# Patient Record
Sex: Male | Born: 1965 | Race: White | Hispanic: No | Marital: Married | Smoking: Never smoker
Health system: Southern US, Community
[De-identification: ages and names within clinical notes are randomized; demographics above are authoritative.]

## PROBLEM LIST (undated history)

## (undated) ENCOUNTER — Ambulatory Visit (HOSPITAL_COMMUNITY): Disposition: A | Discharge status: Home / Self Care | Payer: BC Managed Care – PPO

## (undated) DIAGNOSIS — I10 Essential (primary) hypertension: Secondary | ICD-10-CM

## (undated) DIAGNOSIS — E7201 Cystinuria: Secondary | ICD-10-CM

## (undated) DIAGNOSIS — IMO0002 Reserved for concepts with insufficient information to code with codable children: Secondary | ICD-10-CM

## (undated) DIAGNOSIS — Z87442 Personal history of urinary calculi: Secondary | ICD-10-CM

## (undated) DIAGNOSIS — M1990 Unspecified osteoarthritis, unspecified site: Secondary | ICD-10-CM

## (undated) DIAGNOSIS — N189 Chronic kidney disease, unspecified: Secondary | ICD-10-CM

## (undated) DIAGNOSIS — E785 Hyperlipidemia, unspecified: Secondary | ICD-10-CM

## (undated) DIAGNOSIS — M109 Gout, unspecified: Secondary | ICD-10-CM

## (undated) DIAGNOSIS — E039 Hypothyroidism, unspecified: Secondary | ICD-10-CM

## (undated) HISTORY — DX: Hyperlipidemia, unspecified: E78.5

## (undated) HISTORY — DX: Gout, unspecified: M10.9

## (undated) HISTORY — DX: Chronic kidney disease, unspecified: N18.9

## (undated) HISTORY — DX: Cystinuria: E72.01

## (undated) HISTORY — PX: COLONOSCOPY: SHX174

## (undated) HISTORY — PX: POLYPECTOMY: SHX149

## (undated) HISTORY — PX: KIDNEY STONE SURGERY: SHX686

---

## 1991-08-15 HISTORY — PX: APPENDECTOMY: SHX54

## 1999-03-27 ENCOUNTER — Emergency Department (HOSPITAL_COMMUNITY): Admission: EM | Admit: 1999-03-27 | Discharge: 1999-03-28 | Payer: Self-pay | Admitting: Internal Medicine

## 2005-03-10 ENCOUNTER — Ambulatory Visit (HOSPITAL_BASED_OUTPATIENT_CLINIC_OR_DEPARTMENT_OTHER): Admission: RE | Admit: 2005-03-10 | Discharge: 2005-03-10 | Payer: Self-pay | Admitting: Urology

## 2005-03-10 ENCOUNTER — Ambulatory Visit (HOSPITAL_COMMUNITY): Admission: RE | Admit: 2005-03-10 | Discharge: 2005-03-10 | Payer: Self-pay | Admitting: Urology

## 2007-01-09 ENCOUNTER — Encounter: Admission: RE | Admit: 2007-01-09 | Discharge: 2007-01-09 | Payer: Self-pay | Admitting: Gastroenterology

## 2007-09-17 ENCOUNTER — Ambulatory Visit (HOSPITAL_COMMUNITY): Admission: RE | Admit: 2007-09-17 | Discharge: 2007-09-18 | Payer: Self-pay | Admitting: Urology

## 2008-08-14 HISTORY — PX: BACK SURGERY: SHX140

## 2009-05-07 ENCOUNTER — Encounter: Admission: RE | Admit: 2009-05-07 | Discharge: 2009-05-07 | Payer: Self-pay | Admitting: Family Medicine

## 2009-06-02 ENCOUNTER — Encounter: Admission: RE | Admit: 2009-06-02 | Discharge: 2009-06-02 | Payer: Self-pay | Admitting: Specialist

## 2009-06-16 ENCOUNTER — Observation Stay (HOSPITAL_COMMUNITY): Admission: RE | Admit: 2009-06-16 | Discharge: 2009-06-18 | Payer: Self-pay | Admitting: Specialist

## 2010-09-04 ENCOUNTER — Encounter: Payer: Self-pay | Admitting: Urology

## 2010-11-16 LAB — CBC
HCT: 29.7 % — ABNORMAL LOW (ref 39.0–52.0)
HCT: 30.3 % — ABNORMAL LOW (ref 39.0–52.0)
HCT: 30.6 % — ABNORMAL LOW (ref 39.0–52.0)
HCT: 37.6 % — ABNORMAL LOW (ref 39.0–52.0)
Hemoglobin: 10.4 g/dL — ABNORMAL LOW (ref 13.0–17.0)
Hemoglobin: 10.6 g/dL — ABNORMAL LOW (ref 13.0–17.0)
Hemoglobin: 10.7 g/dL — ABNORMAL LOW (ref 13.0–17.0)
Hemoglobin: 13 g/dL (ref 13.0–17.0)
MCHC: 34.6 g/dL (ref 30.0–36.0)
MCHC: 34.8 g/dL (ref 30.0–36.0)
MCHC: 34.9 g/dL (ref 30.0–36.0)
MCHC: 35 g/dL (ref 30.0–36.0)
MCV: 92 fL (ref 78.0–100.0)
MCV: 92.2 fL (ref 78.0–100.0)
MCV: 92.7 fL (ref 78.0–100.0)
MCV: 93.5 fL (ref 78.0–100.0)
Platelets: 160 10*3/uL (ref 150–400)
Platelets: 189 10*3/uL (ref 150–400)
Platelets: 194 10*3/uL (ref 150–400)
Platelets: 276 10*3/uL (ref 150–400)
RBC: 3.23 MIL/uL — ABNORMAL LOW (ref 4.22–5.81)
RBC: 3.24 MIL/uL — ABNORMAL LOW (ref 4.22–5.81)
RBC: 3.32 MIL/uL — ABNORMAL LOW (ref 4.22–5.81)
RBC: 4.05 MIL/uL — ABNORMAL LOW (ref 4.22–5.81)
RDW: 13.5 % (ref 11.5–15.5)
RDW: 13.9 % (ref 11.5–15.5)
RDW: 14 % (ref 11.5–15.5)
RDW: 14.2 % (ref 11.5–15.5)
WBC: 13.6 10*3/uL — ABNORMAL HIGH (ref 4.0–10.5)
WBC: 7.1 10*3/uL (ref 4.0–10.5)
WBC: 8.4 10*3/uL (ref 4.0–10.5)
WBC: 9.7 10*3/uL (ref 4.0–10.5)

## 2010-11-16 LAB — PROTIME-INR
INR: 1.04 (ref 0.00–1.49)
Prothrombin Time: 13.5 seconds (ref 11.6–15.2)

## 2010-11-16 LAB — GLUCOSE, CAPILLARY
Glucose-Capillary: 121 mg/dL — ABNORMAL HIGH (ref 70–99)
Glucose-Capillary: 126 mg/dL — ABNORMAL HIGH (ref 70–99)
Glucose-Capillary: 126 mg/dL — ABNORMAL HIGH (ref 70–99)
Glucose-Capillary: 128 mg/dL — ABNORMAL HIGH (ref 70–99)
Glucose-Capillary: 143 mg/dL — ABNORMAL HIGH (ref 70–99)
Glucose-Capillary: 144 mg/dL — ABNORMAL HIGH (ref 70–99)
Glucose-Capillary: 156 mg/dL — ABNORMAL HIGH (ref 70–99)
Glucose-Capillary: 156 mg/dL — ABNORMAL HIGH (ref 70–99)
Glucose-Capillary: 164 mg/dL — ABNORMAL HIGH (ref 70–99)
Glucose-Capillary: 205 mg/dL — ABNORMAL HIGH (ref 70–99)

## 2010-11-16 LAB — URINALYSIS, ROUTINE W REFLEX MICROSCOPIC
Bilirubin Urine: NEGATIVE
Glucose, UA: NEGATIVE mg/dL
Hgb urine dipstick: NEGATIVE
Ketones, ur: NEGATIVE mg/dL
Nitrite: NEGATIVE
Protein, ur: 30 mg/dL — AB
Specific Gravity, Urine: 1.026 (ref 1.005–1.030)
Urobilinogen, UA: 0.2 mg/dL (ref 0.0–1.0)
pH: 5.5 (ref 5.0–8.0)

## 2010-11-16 LAB — COMPREHENSIVE METABOLIC PANEL
ALT: 32 U/L (ref 0–53)
AST: 33 U/L (ref 0–37)
Albumin: 4.1 g/dL (ref 3.5–5.2)
Alkaline Phosphatase: 56 U/L (ref 39–117)
BUN: 22 mg/dL (ref 6–23)
CO2: 31 mEq/L (ref 19–32)
Calcium: 9.8 mg/dL (ref 8.4–10.5)
Chloride: 100 mEq/L (ref 96–112)
Creatinine, Ser: 1.72 mg/dL — ABNORMAL HIGH (ref 0.4–1.5)
GFR calc Af Amer: 53 mL/min — ABNORMAL LOW (ref >60)
GFR calc non Af Amer: 43 mL/min — ABNORMAL LOW (ref >60)
Glucose, Bld: 134 mg/dL — ABNORMAL HIGH (ref 70–99)
Potassium: 3.8 mEq/L (ref 3.5–5.1)
Sodium: 140 mEq/L (ref 135–145)
Total Bilirubin: 0.8 mg/dL (ref 0.3–1.2)
Total Protein: 7.5 g/dL (ref 6.0–8.3)

## 2010-11-16 LAB — PLATELET FUNCTION ASSAY
Collagen / ADP: 114 seconds (ref 0–114)
Collagen / Epinephrine: 123 seconds (ref 0–184)
Collagen / Epinephrine: 186 seconds (ref 0–184)

## 2010-11-16 LAB — BASIC METABOLIC PANEL
BUN: 17 mg/dL (ref 6–23)
CO2: 29 mEq/L (ref 19–32)
Calcium: 9 mg/dL (ref 8.4–10.5)
Chloride: 104 mEq/L (ref 96–112)
Creatinine, Ser: 1.55 mg/dL — ABNORMAL HIGH (ref 0.4–1.5)
GFR calc Af Amer: 59 mL/min — ABNORMAL LOW (ref >60)
GFR calc non Af Amer: 49 mL/min — ABNORMAL LOW (ref >60)
Glucose, Bld: 142 mg/dL — ABNORMAL HIGH (ref 70–99)
Potassium: 4.8 mEq/L (ref 3.5–5.1)
Sodium: 138 mEq/L (ref 135–145)

## 2010-11-16 LAB — URINE MICROSCOPIC-ADD ON

## 2010-11-16 LAB — DIFFERENTIAL
Basophils Absolute: 0 10*3/uL (ref 0.0–0.1)
Basophils Relative: 0 % (ref 0–1)
Eosinophils Absolute: 0.1 10*3/uL (ref 0.0–0.7)
Eosinophils Relative: 1 % (ref 0–5)
Lymphocytes Relative: 12 % (ref 12–46)
Lymphs Abs: 0.8 10*3/uL (ref 0.7–4.0)
Monocytes Absolute: 0.2 10*3/uL (ref 0.1–1.0)
Monocytes Relative: 3 % (ref 3–12)
Neutro Abs: 6 10*3/uL (ref 1.7–7.7)
Neutrophils Relative %: 84 % — ABNORMAL HIGH (ref 43–77)

## 2010-11-16 LAB — POCT I-STAT 4, (NA,K, GLUC, HGB,HCT)
Glucose, Bld: 158 mg/dL — ABNORMAL HIGH (ref 70–99)
HCT: 32 % — ABNORMAL LOW (ref 39.0–52.0)
Hemoglobin: 10.9 g/dL — ABNORMAL LOW (ref 13.0–17.0)
Potassium: 4.7 mEq/L (ref 3.5–5.1)
Sodium: 136 mEq/L (ref 135–145)

## 2010-11-16 LAB — APTT: aPTT: 30 seconds (ref 24–37)

## 2010-12-27 NOTE — Op Note (Signed)
NAME:  Jorge Dyer, Jorge Dyer                 ACCOUNT NO.:  [id number]   MEDICAL RECORD NO.:  [id number]          PATIENT TYPE:  AMB   LOCATION:  DAY                          FACILITY:  [facility]   PHYSICIAN:  Mark C. Vernie Ammons, M.D.  DATE OF BIRTH:  December 01, 1965   DATE OF PROCEDURE:  09/17/2007  DATE OF DISCHARGE:                               OPERATIVE REPORT   PREOPERATIVE DIAGNOSIS:  Left renal calculus.   POSTOPERATIVE DIAGNOSIS:  Left renal calculus.   PROCEDURE:  Left percutaneous nephrostolithotomy (aggregate size 2.1cm)  with antegrade nephrostogram and double-J stent placement.   SURGEON:  Mark C. Vernie Ammons, M.D.   RADIOLOGIST:  Arn Medal, M.D.   ANESTHESIA:  General.   BLOOD LOSS:  Approximately 100 mL.   DRAINS:  6 French 24 cm double-J stent in the left ureter.   COMPLICATIONS:  None seen.   SPECIMENS:  Stone given to the patient.   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  He had a large cystine stone located at the ureteropelvic  junction and since it was too large for ureteroscopic treatment in an  antegrade fashion and its composition made it not suitable for  lithotripsy, I discussed with the patient percutaneous  nephrostolithotomy.  We went over the procedure, risks, complications,  alternatives, and limitations.  He understands and elected to proceed.   DESCRIPTION OF OPERATION:  After informed consent, the patient was taken  to the major OR, placed on the operating table, and administered general  anesthesia in the supine position.  He was then moved to the prone  position with all pressure points being padded.  The previously placed  nephrostomy tube exiting the left flank was identified and the  nephrostomy tube as well as the left flank was sterilely prepped and  draped.  An official time out was then performed.   A 0.03 inch floppy tip guidewire was then passed down through the  nephrostomy catheter by Dr. Lowella Dandy under direct  fluoroscopy.  A coaxial  introducer was then placed and the skin was incised to accommodate the  30-French nephrostomy sheath.  A second guidewire was then passed under  direct fluoroscopy and then the nephrostomy dilating balloon was then  passed over the working guidewire and a second guidewire was maintained  as a safety guidewire.  The balloon was then inflated and the  nephrostomy access sheath was then passed over the inflated nephrostomy  into the area of the renal pelvis.   The 72 French rigid nephroscope was then passed over the working  guidewire, through the nephrostomy sheath, and into the kidney where the  stone was visualized.  I then used the Swiss lithoclast to fragment the  stone using primarily ultrasound in order to fragment and aspirate the  stone simultaneously with intermittent use of the pneumatic portion to  speed fragmentation of the stone.  I was able to fully fragment the  stone and completely removed all the fragments.  I was able to then  visualize down the ureter and see no further stone fragments present  down the ureter nor in the area of the UPJ or renal pelvis.  I then  directed the scope toward the middle pole where I found a single stone  and was able to grasp it with the two pronged grasping tool and actually  crush the stone into less than 7 mm fragments.   The renal pelvis was then visually inspected and noted to appear intact.  I, therefore, passed a second guidewire back down the ureter under  fluoroscopy and passed the 6 French double-J stent over the guidewire.  As I removed the guidewire, a good curl was noted in the bladder.  I  then completely removed the guidewire and passed the nephroscope over  the safety guidewire and then visualized the stent.  It was located at  the UPJ so I grasped it with the two pronged grasper and pulled it into  the renal pelvis where a good curl was noted in the area of the renal  pelvis.  There was no active  bleeding occurring so I removed the  nephroscope and then elected to perform this as a percutaneous  nephrostolithotomy.  The Tisseel solutions were connected to the  laparoscopic injector and this was passed into the wound that was  previously measured to the depth of the kidney and I injected the  Tisseel as I removed the laparoscopic injector.  No bleeding was noted  and I, therefore, closed the skin with a figure-of-eight 2-0 nylon  suture.  A sterile occlusive dressing was applied and the patient was  awakened and taken to recovery room in stable satisfactory condition.  A  Foley catheter was placed at the beginning of the procedure and that  will remain overnight and he will be observed overnight with plans to  discharge home in the morning.      Mark C. Vernie Ammons, M.D.  Electronically Signed     MCO/MEDQ  D:  09/17/2007  T:  09/17/2007  Job:  [id number]

## 2010-12-30 NOTE — Op Note (Signed)
NAME:  Jorge Dyer, Jorge Dyer NO.:  [id number]   MEDICAL RECORD NO.:  [id number]          PATIENT TYPE:  AMB   LOCATION:  [facility]                         FACILITY:  [facility]   PHYSICIAN:  Courtney Paris, M.D.DATE OF BIRTH:  1966/02/13   DATE OF PROCEDURE:  03/10/2005  DATE OF DISCHARGE:                                 OPERATIVE REPORT   PREOPERATIVE DIAGNOSIS:  Desires sterility.   POSTOPERATIVE DIAGNOSIS:  Desires sterility.   PROCEDURE:  Bilateral vasectomy.   DESCRIPTION OF PROCEDURE:  The patient was identified by his surgical  bracelet and brought to room 1 where he received preoperative antibiotics  and prepped and draped in the usual sterile fashion.  We palpated his left  cord and isolated the vas deferens and brought it to the skin anteriorly.  A  small skin nick was made over the vas deferens which was held between first  and fifth finger.  We dissected down through the subcutaneous tissue with  microdissector.  Next, the vas deferens was isolated with the ring pick-up  and brought through the incision.  Using sharp dissection, we cleared off  the adventitia and isolated the vas deferens only.  It was ligated with 3-0  chromic suture and divided with a gap of approximately 0.5 cm.  We then  inspected the operative field for hemostasis, found to be hemostatic, and we  dropped the divided ends of the vas through the incision which was closed  with a single subcuticular 4-0 chromic suture.  We next repeated the process  on the right, isolating the vas deferens between the surgeon's first and  fifth finger, bringing it to the level of the skin, making a small skin nick  transversely, then dissecting through the subcutaneous tissue with sharp  microdissectors.  We then brought the cord, which was palpated on the  surgeon's fifth finger, through the incision on the ring pick-up forceps.  We next incised the adventitia sharply, isolating only the vas deferens.  We  ligated proximal and distal and divided 2.5 cm in length and ligated the  proximal and distal end with 3-0 chromic ties.  Next, the wound was found to  be hemostatic.  The divided vas deferens was returned into the field, and  the wound was closed with a single subcuticular 0 chromic stitch.  The  patient was reversed, and the anesthesia was tolerated well without  complications.  Please note Dr. Aldean Ast was present and participated in  all aspects of this case.     ______________________________  Glade Nurse, MD      Courtney Paris, M.D.  Electronically Signed    MT/MEDQ  D:  03/10/2005  T:  03/10/2005  Job:  [id number]

## 2010-12-30 NOTE — Op Note (Signed)
NAME:  Jorge Dyer, Jorge Dyer                 ACCOUNT NO.:  [id number]   MEDICAL RECORD NO.:  [id number]          PATIENT TYPE:  AMB   LOCATION:  [facility]                         FACILITY:  [facility]   PHYSICIAN:  Courtney Paris, M.D.DATE OF BIRTH:  Apr 17, 1966   DATE OF PROCEDURE:  03/10/2005  DATE OF DISCHARGE:  03/10/2005                                 OPERATIVE REPORT   PREOPERATIVE DIAGNOSIS:  Elective sterilization.   POSTOPERATIVE DIAGNOSIS:  Elective sterilization.   OPERATION:  Bilateral vasectomy.   ANESTHESIA:  General.   SURGEON:  Courtney Paris, M.D.   ASSISTANT:  Glade Nurse, M.D.   BRIEF HISTORY:  This 45 year old patient was admitted for elective  sterilization.  He had a very small scrotum and retractile testes in the  office and I felt he would not be a good candidate to do this under local  anesthesia in the office.  Therefore, anesthesia was done.   DESCRIPTION OF PROCEDURE:  He was prepped and draped in the usual sterile  fashion and then the vas was picked up in the left upper scrotum and a small  transverse incision made over this.  Using a vas hook, this was pulled out  and the vas was dissected free from the cord.  A small section was then cut  and the ends were then tied with 3-0 chromic catgut suture and the vas  dropped back into the left hemiscrotal compartment.  On the patient's right  side, a similar procedure was done.  The vas was picked up and a small  transverse incision made over the vas in the right upper scrotum and the vas  was delivered in a similar fashion.  A small segment was again dissected  free and the ends were then tied with 3-0 chromic catgut suture.  Hemostasis  was quite good.  The vas was then dropped back in the right hemiscrotum and  the skin incisions were then closed with interrupted 4-0 chromic suture and  a little bit of local was used over each incision for postoperative pain  relief.  The patient was then taken to the  recovery room in good condition  and will be later discharged as an outpatient.      Courtney Paris, M.D.  Electronically Signed     HMK/MEDQ  D:  03/30/2005  T:  03/30/2005  Job:  [id number]

## 2011-05-04 LAB — BASIC METABOLIC PANEL
BUN: 15
CO2: 31
Calcium: 9.5
Chloride: 98
Creatinine, Ser: 1.21
GFR calc Af Amer: >60
GFR calc non Af Amer: >60
Glucose, Bld: 208 — ABNORMAL HIGH
Potassium: 3.6
Sodium: 137

## 2011-05-04 LAB — URINALYSIS, ROUTINE W REFLEX MICROSCOPIC
Bilirubin Urine: NEGATIVE
Glucose, UA: NEGATIVE
Hgb urine dipstick: NEGATIVE
Ketones, ur: NEGATIVE
Nitrite: NEGATIVE
Protein, ur: NEGATIVE
Specific Gravity, Urine: 1.016
Urobilinogen, UA: 0.2
pH: 7

## 2011-05-04 LAB — CBC
HCT: 39.4
Hemoglobin: 13.7
MCHC: 34.8
MCV: 92
Platelets: 224
RBC: 4.29
RDW: 13.7
WBC: 7.5

## 2011-05-05 LAB — HEMOGLOBIN AND HEMATOCRIT, BLOOD
HCT: 34.9 — ABNORMAL LOW
Hemoglobin: 12.2 — ABNORMAL LOW

## 2011-10-30 ENCOUNTER — Ambulatory Visit (INDEPENDENT_AMBULATORY_CARE_PROVIDER_SITE_OTHER): Payer: BC Managed Care – PPO | Admitting: Family Medicine

## 2011-10-30 VITALS — BP 127/76 | HR 71 | Temp 98.0°F | Resp 16 | Ht 71.5 in | Wt 285.2 lb

## 2011-10-30 DIAGNOSIS — H6690 Otitis media, unspecified, unspecified ear: Secondary | ICD-10-CM

## 2011-10-30 DIAGNOSIS — E119 Type 2 diabetes mellitus without complications: Secondary | ICD-10-CM | POA: Insufficient documentation

## 2011-10-30 DIAGNOSIS — J019 Acute sinusitis, unspecified: Secondary | ICD-10-CM

## 2011-10-30 DIAGNOSIS — J329 Chronic sinusitis, unspecified: Secondary | ICD-10-CM

## 2011-10-30 DIAGNOSIS — H66009 Acute suppurative otitis media without spontaneous rupture of ear drum, unspecified ear: Secondary | ICD-10-CM

## 2011-10-30 MED ORDER — CEFDINIR 300 MG PO CAPS: 300.0000 mg | ORAL_CAPSULE | Freq: Two times a day (BID) | ORAL | Status: AC

## 2011-10-30 NOTE — Progress Notes (Signed)
  Patient Name: Jorge Dyer Date of Birth: 01-16-1966 Medical Record Number: [id number] Gender: male Date of Encounter: 10/30/2011  History of Present Illness:  Jorge Dyer is a 46 y.o. very pleasant male patient who presents with the following:  Here with sinus symptoms for about one week- has sinus pressure and pain, mild right earache as well, mild cough started the last couple of days as well.  He has not had a fever- he has checked.  No chills, no aches.  He does have a mild ST.  No GI symptoms.   His PCP is Dr. Clelia Croft- he follows his DM for him.  His glucose usually run 180 or so - this has not changed  Patient Active Problem List  Diagnoses  . Diabetes mellitus   No past medical history on file. No past surgical history on file. History  Substance Use Topics  . Smoking status: Never Smoker   . Smokeless tobacco: Not on file  . Alcohol Use: Yes     ocassionally   No family history on file. No Known Allergies  Medication list has been reviewed and updated.  Review of Systems: As per HPI- otherwise negative.  Physical Examination: Filed Vitals:   10/30/11 1506  BP: 127/76  Pulse: 71  Temp: 98 F (36.7 C)  TempSrc: Oral  Resp: 16  Height: 5' 11.5" (1.816 m)  Weight: 285 lb 3.2 oz (129.366 kg)    Body mass index is 39.22 kg/(m^2).  GEN: WDWN, NAD, Non-toxic, A & O x 3, obese HEENT: Atraumatic, Normocephalic. Neck supple. No masses, No LAD.  Right TM wnl, left TM is red and dull, oropharynx wnl.  Frontal sinuses congested and full Ears and Nose: No external deformity. CV: RRR, No M/G/R. No JVD. No thrill. No extra heart sounds. PULM: CTA B, no wheezes, crackles, rhonchi. No retractions. No resp. distress. No accessory muscle use. Extr: no C/C/E NEURO Normal gait.  PSYCH: Normally interactive. Conversant. Not depressed or anxious appearing.  Calm demeanor.    Assessment and Plan: 1. Sinusitis  cefdinir (OMNICEF) 300 MG capsule  2. Diabetes mellitus    3.  Otitis media  cefdinir (OMNICEF) 300 MG capsule   Cover with omnicef as above- Patient (or parent if minor) instructed to return to clinic or call if not better in >25 minutes spent in face to face time with patient, >50% spent in counselling or coordination of care-3 day(s). Sooner if worse.

## 2012-01-29 ENCOUNTER — Ambulatory Visit (INDEPENDENT_AMBULATORY_CARE_PROVIDER_SITE_OTHER): Payer: BC Managed Care – PPO | Admitting: Family Medicine

## 2012-01-29 VITALS — BP 119/76 | HR 68 | Temp 97.9°F | Resp 18 | Ht 72.0 in | Wt 280.0 lb

## 2012-01-29 DIAGNOSIS — R1011 Right upper quadrant pain: Secondary | ICD-10-CM

## 2012-01-29 NOTE — Progress Notes (Signed)
Patient ID: Jorge Dyer, male   DOB: 1965/09/03, 46 y.o.   MRN: [id number] Jorge Dyer is a 46 y.o. male who presents to Urgent Care today for RUQ pain of past 4-5 day's duration.    1. RUQ pain: Has never had pain like this previously.  Describes as dull aching pain. No history of gallbladder issues in past.  Describes as 2-3/10 in nature, worsens to 4/10 after meals.  Has been cutting back on fatty/greasy foods.  Wife had issues with gallstones and had cholecystectomy, thus he knew to cut back on greasy foods.  No nausea, vomiting, fevers, chills.  Sleeping well without pain at night.    Bowels normal, daily bowel movement and passing flatus.    PMH reviewed.  Previous appendectomy as only surgery, about 20 years ago.  No other surgeries. ROS as above otherwise neg Medications reviewed. Current Outpatient Prescriptions  Medication Sig Dispense Refill  . levothyroxine (SYNTHROID, LEVOTHROID) 25 MCG tablet Take 25 mcg by mouth daily.      . Liraglutide (VICTOZA) 18 MG/3ML SOLN Inject into the skin.      Marland Kitchen losartan (COZAAR) 25 MG tablet Take 25 mg by mouth daily.      . simvastatin (ZOCOR) 10 MG tablet Take 10 mg by mouth at bedtime.      Marland Kitchen tiopronin (THIOLA) 100 MG tablet Take 600 mg by mouth daily. Pt states he takes six 100 mg daily        Exam:  BP 119/76  Pulse 68  Temp 97.9 F (36.6 C) (Oral)  Resp 18  Ht 6' (1.829 m)  Wt 280 lb (127.007 kg)  BMI 37.97 kg/m2 Gen: Well NAD HEENT: EOMI,  MMM Lungs: CTABL Nl WOB Heart: RRR no MRG Abd:  Minimal Right upper quadrant pain.  Murphy's negative.  Otherwise abdomen benign, no guarding or rebound, BS good throughout.  Surgical scar noted. Exts: Non edematous BL  LE, warm and well perfused.   Assessment and Plan:  1. RUQ pain:  Likely cholecystitis.  Plan for urgent ultrasound and referral to surgery, especially as patient desires referral.  FU with [place] and surgery in next 1-2 days.  Provided patient red flags that would prompt return  to ED or here.  No labs tonight due to mild abdominal exam.

## 2012-01-29 NOTE — Patient Instructions (Addendum)
I have put in an order for an abdominal ultrasound.  I have also put in a referral to general surgery.   If you haven't heard anything by the end of the week please call and check with [place].   If you have fever, chills, nausea and vomiting, or worsening of pain come back and see [place] or go to the ED if it is after hours.

## 2012-02-05 ENCOUNTER — Ambulatory Visit
Admission: RE | Admit: 2012-02-05 | Discharge: 2012-02-05 | Disposition: A | Discharge status: Home / Self Care | Payer: BC Managed Care – PPO | Source: Ambulatory Visit | Attending: Internal Medicine | Admitting: Internal Medicine

## 2012-02-05 DIAGNOSIS — R1011 Right upper quadrant pain: Secondary | ICD-10-CM

## 2012-02-07 ENCOUNTER — Telehealth: Payer: Self-pay

## 2012-02-07 NOTE — Telephone Encounter (Signed)
Pt is looking for results of an ultrasound from monday

## 2012-02-07 NOTE — Telephone Encounter (Signed)
See us report

## 2012-02-28 ENCOUNTER — Ambulatory Visit (INDEPENDENT_AMBULATORY_CARE_PROVIDER_SITE_OTHER): Payer: BC Managed Care – PPO | Admitting: Surgery

## 2012-02-28 ENCOUNTER — Encounter (INDEPENDENT_AMBULATORY_CARE_PROVIDER_SITE_OTHER): Payer: Self-pay | Admitting: Surgery

## 2012-02-28 VITALS — BP 134/76 | HR 60 | Temp 97.4°F | Resp 14 | Ht 72.0 in | Wt 273.4 lb

## 2012-02-28 DIAGNOSIS — K8010 Calculus of gallbladder with chronic cholecystitis without obstruction: Secondary | ICD-10-CM

## 2012-02-28 NOTE — Patient Instructions (Signed)
[facility] LAPAROSCOPIC SURGERY: POST OP INSTRUCTIONS  Always review your discharge instruction sheet given to you by the facility where your surgery was performed.  1. A prescription for pain medication may be given to you upon discharge.  Take your pain medication as prescribed.  If narcotic pain medicine is not needed, then you may take acetaminophen (Tylenol) or ibuprofen (Advil) as needed. 2. Take your usually prescribed medications unless otherwise directed. 3. If you need a refill on your pain medication, please contact your pharmacy.  They will contact our office to request authorization. Prescriptions will not be filled after 5 P.M. or on weekends. 4. You should follow a light diet the first few days after arrival home, such as soup and crackers or toast.  Be sure to include plenty of fluids daily. 5. Most patients will experience some swelling and bruising in the area of the incisions.  Ice packs will help.  Swelling and bruising can take several days to resolve.  6. It is common to experience some constipation if taking pain medication after surgery.  Increasing fluid intake and taking a stool softener (such as Colace) will usually help or prevent this problem from occurring.  A mild laxative (Milk of Magnesia or Miralax) should be taken according to package instructions if there are no bowel movements after 48 hours. 7. Unless discharge instructions indicate otherwise, you may remove your bandages 24-48 hours after surgery, and you may shower at that time.  You may have steri-strips (small skin tapes) in place directly over the incision.  These strips should be left on the skin for 7-10 days.  If your surgeon used skin glue on the incision, you may shower in 24 hours.  The glue will flake off over the next 2-3 weeks.  Any sutures or staples will be removed at the office during your follow-up visit. 8. ACTIVITIES:  You may resume regular (light) daily activities beginning  the next day-such as daily self-care, walking, climbing stairs-gradually increasing activities as tolerated.  You may have sexual intercourse when it is comfortable.  Refrain from any heavy lifting or straining until approved by your doctor. 9. You may drive when you are no longer taking prescription pain medication, you can comfortably wear a seatbelt, and you can safely maneuver your car and apply brakes. 10. You should see your doctor in the office for a follow-up appointment approximately 2-3 weeks after your surgery.  Make sure that you call for this appointment within a day or two after you arrive home to insure a convenient appointment time.  WHEN TO CALL YOUR DOCTOR: 1. Fever over 101.0 2. Inability to urinate 3. Continued bleeding from incision 4. Increased pain, redness, or drainage from the incision 5. Increasing abdominal pain  The clinic staff is available to answer your questions during regular business hours.  Please don't hesitate to call and ask to speak to one of the nurses for clinical concerns.  If you have a medical emergency, go to the nearest emergency room or call 911.  A surgeon from [facility] is always on call for the hospital. [phone] ? [phone] ? FAX [phone] Web site: [email or web address]  

## 2012-02-28 NOTE — Progress Notes (Signed)
General Surgery [facility]  Chief Complaint  Patient presents with  . Abdominal Pain    new pt - eval RUQ pain and gallstones - referral from Dr. Payton Mccallum, [place]    HISTORY: Patient is a 46 year old white male referred from urgent care with an episode of biliary colic. Approximately one month ago the patient was at an out-of-town wedding. He was eating heavy food late at night.  He experienced onset of right upper quadrant abdominal pain. Patient was seen by primary care.  Abdominal ultrasound was obtained on 02/05/2012. This showed sub-centimeter gallstones but no evidence of acute cholecystitis.  Patient is now referred to surgery for evaluation of biliary colic. Of note the patient does have type 2 diabetes.  Patient has had no prior history of hepatobiliary disease. He denies jaundice. He denies acholic stools.  He has had no history of hepatitis or pancreatitis. His father did have cholecystectomy at a young age.  Past Medical History  Diagnosis Date  . Diabetes mellitus   . Hyperlipidemia   . Gallstones   . Chronic kidney disease     h/o kidney stones     Current Outpatient Prescriptions  Medication Sig Dispense Refill  . levothyroxine (SYNTHROID, LEVOTHROID) 25 MCG tablet Take 25 mcg by mouth daily.      . Liraglutide (VICTOZA) 18 MG/3ML SOLN Inject into the skin.      Marland Kitchen losartan-hydrochlorothiazide (HYZAAR) 100-25 MG per tablet daily.      . simvastatin (ZOCOR) 10 MG tablet Take 10 mg by mouth at bedtime.      Marland Kitchen tiopronin (THIOLA) 100 MG tablet Take 600 mg by mouth daily. Pt states he takes six 100 mg daily         No Known Allergies   History reviewed. No pertinent family history.   History   Social History  . Marital Status: Married    Spouse Name: N/A    Number of Children: N/A  . Years of Education: N/A   Social History Main Topics  . Smoking status: Never Smoker   . Smokeless tobacco: Never Used  . Alcohol Use: Yes   ocassionally  . Drug Use: No  . Sexually Active: Yes   Other Topics Concern  . None   Social History Narrative  . None     REVIEW OF SYSTEMS - PERTINENT POSITIVES ONLY: Denies jaundice, denies acholic stools, denies fever  EXAM: Filed Vitals:   02/28/12 0916  BP: 134/76  Pulse: 60  Temp: 97.4 F (36.3 C)  Resp: 14    HEENT: normocephalic; pupils equal and reactive; sclerae clear; dentition good; mucous membranes moist NECK:  symmetric on extension; no palpable anterior or posterior cervical lymphadenopathy; no supraclavicular masses; no tenderness CHEST: clear to auscultation bilaterally without rales, rhonchi, or wheezes CARDIAC: regular rate and rhythm without significant murmur; peripheral pulses are full ABDOMEN: soft without distension; bowel sounds present; no mass; no hepatosplenomegaly; no hernia; well-healed lower midline surgical wound EXT:  non-tender without edema; no deformity NEURO: no gross focal deficits; no sign of tremor   LABORATORY RESULTS: See [facility] (CHL-Epic) for most recent results   RADIOLOGY RESULTS: See [facility] (CHL-Epic) for most recent results   IMPRESSION: Cholelithiasis, recent episode of biliary colic Type 2 diabetes Hypertension  PLAN: Patient and I discussed the above findings and reviewed his ultrasound study. We discussed cholelithiasis and the potential complications including choledocholithiasis, cholangitis, and pancreatitis. Patient is currently actively attempting to lose weight. He would  like to monitor his symptoms. If he has recurrent episodes of biliary colic that he will contact me regarding proceeding with laparoscopic cholecystectomy. I cautioned him as to the signs and symptoms of complications. He understands.  Patient will return if he has persistent symptoms or when he desires to proceed with laparoscopic cholecystectomy.  Velora Heckler, MD, FACS General & Endocrine Surgery [facility]   Visit Diagnoses: 1. Cholelithiasis with cholecystitis, chronic     Primary Care Physician: Kari Baars, MD

## 2014-03-01 ENCOUNTER — Ambulatory Visit (INDEPENDENT_AMBULATORY_CARE_PROVIDER_SITE_OTHER): Payer: BC Managed Care – PPO | Admitting: Emergency Medicine

## 2014-03-01 VITALS — BP 120/78 | HR 78 | Temp 98.1°F | Resp 18 | Ht 72.0 in | Wt 281.0 lb

## 2014-03-01 DIAGNOSIS — M109 Gout, unspecified: Secondary | ICD-10-CM

## 2014-03-01 MED ORDER — INDOMETHACIN 50 MG PO CAPS: 50.0000 mg | ORAL_CAPSULE | Freq: Three times a day (TID) | ORAL | Status: DC | PRN

## 2014-03-01 MED ORDER — COLCHICINE 0.6 MG PO TABS: ORAL_TABLET | ORAL | Status: DC

## 2014-03-01 NOTE — Progress Notes (Signed)
[facility] [address] [place] [address] [phone]  Date:  03/01/2014   Name:  Jorge Dyer   DOB:  1966/05/05   MRN:  [id number]  PCP:  Marton Redwood, MD    Chief Complaint: Gout and Foot Pain   History of Present Illness:  Jorge Dyer is a 48 y.o. very pleasant male patient who presents with the following:  History of gout.  Out of medication.  No history of injury or overuse. No improvement with over the counter medications or other home remedies. Denies other complaint or health concern today.   Patient Active Problem List   Diagnosis Date Noted  . Cholelithiasis with cholecystitis, chronic 02/28/2012  . Diabetes mellitus 10/30/2011    Past Medical History  Diagnosis Date  . Diabetes mellitus   . Hyperlipidemia   . Gallstones   . Chronic kidney disease     h/o kidney stones  . Gout     Past Surgical History  Procedure Laterality Date  . Back surgery    . Kidney stone surgery    . Appendectomy    . Spine surgery      History  Substance Use Topics  . Smoking status: Never Smoker   . Smokeless tobacco: Never Used  . Alcohol Use: Yes     Comment: ocassionally    History reviewed. No pertinent family history.  No Known Allergies  Medication list has been reviewed and updated.  Current Outpatient Prescriptions on File Prior to Visit  Medication Sig Dispense Refill  . losartan-hydrochlorothiazide (HYZAAR) 100-25 MG per tablet daily.      Marland Kitchen tiopronin (THIOLA) 100 MG tablet Take 600 mg by mouth daily. Pt states he takes six 100 mg daily      . levothyroxine (SYNTHROID, LEVOTHROID) 25 MCG tablet Take 25 mcg by mouth daily.      . Liraglutide (VICTOZA) 18 MG/3ML SOLN Inject into the skin.      Marland Kitchen simvastatin (ZOCOR) 10 MG tablet Take 10 mg by mouth at bedtime.       No current facility-administered medications on file prior to visit.    Review of Systems:  As per HPI, otherwise negative.    Physical Examination: Filed  Vitals:   03/01/14 0918  BP: 120/78  Pulse: 78  Temp: 98.1 F (36.7 C)  Resp: 18   Filed Vitals:   03/01/14 0918  Height: 6' (1.829 m)  Weight: 281 lb (127.461 kg)   Body mass index is 38.1 kg/(m^2). Ideal Body Weight: Weight in (lb) to have BMI = 25: 183.9   GEN: WDWN, NAD, Non-toxic, Alert & Oriented x 3 HEENT: Atraumatic, Normocephalic.  Ears and Nose: No external deformity. EXTR: No clubbing/cyanosis/edema NEURO: Normal gait.  PSYCH: Normally interactive. Conversant. Not depressed or anxious appearing.  Calm demeanor.  Left foot:  Swollen red and tender ankle.    Assessment and Plan: Gout Indocin  Signed,  Ellison Carwin, MD

## 2014-03-01 NOTE — Patient Instructions (Signed)

## 2014-10-16 ENCOUNTER — Encounter: Payer: Self-pay | Admitting: Internal Medicine

## 2014-12-07 ENCOUNTER — Ambulatory Visit (AMBULATORY_SURGERY_CENTER): Payer: Self-pay | Admitting: *Deleted

## 2014-12-07 VITALS — Ht 72.0 in | Wt 277.0 lb

## 2014-12-07 DIAGNOSIS — Z1211 Encounter for screening for malignant neoplasm of colon: Secondary | ICD-10-CM

## 2014-12-07 MED ORDER — NA SULFATE-K SULFATE-MG SULF 17.5-3.13-1.6 GM/177ML PO SOLN: ORAL | Status: DC

## 2014-12-07 NOTE — Progress Notes (Signed)
Patient denies any allergies to eggs or soy. Patient denies any problems with anesthesia/sedation. Patient denies any oxygen use at home and does not take any diet/weight loss medications. EMMI education assisgned to patient on colonoscopy, this was explained and instructions given to patient. 

## 2014-12-17 ENCOUNTER — Encounter: Payer: Self-pay | Admitting: Internal Medicine

## 2014-12-21 ENCOUNTER — Encounter: Payer: Self-pay | Admitting: Internal Medicine

## 2015-01-14 ENCOUNTER — Encounter: Payer: Self-pay | Admitting: Internal Medicine

## 2015-03-10 ENCOUNTER — Encounter: Payer: BLUE CROSS/BLUE SHIELD | Admitting: Internal Medicine

## 2015-03-10 ENCOUNTER — Telehealth: Payer: Self-pay | Admitting: Internal Medicine

## 2015-06-23 ENCOUNTER — Ambulatory Visit (INDEPENDENT_AMBULATORY_CARE_PROVIDER_SITE_OTHER): Payer: BLUE CROSS/BLUE SHIELD | Admitting: Family Medicine

## 2015-06-23 ENCOUNTER — Ambulatory Visit (INDEPENDENT_AMBULATORY_CARE_PROVIDER_SITE_OTHER): Payer: BLUE CROSS/BLUE SHIELD

## 2015-06-23 VITALS — BP 120/80 | HR 69 | Temp 97.6°F | Resp 20 | Ht 72.0 in | Wt 274.6 lb

## 2015-06-23 DIAGNOSIS — M79604 Pain in right leg: Secondary | ICD-10-CM | POA: Diagnosis not present

## 2015-06-23 DIAGNOSIS — G5701 Lesion of sciatic nerve, right lower limb: Secondary | ICD-10-CM | POA: Diagnosis not present

## 2015-06-23 DIAGNOSIS — M25551 Pain in right hip: Secondary | ICD-10-CM | POA: Diagnosis not present

## 2015-06-23 MED ORDER — MELOXICAM 7.5 MG PO TABS: 7.5000 mg | ORAL_TABLET | Freq: Every day | ORAL | Status: DC | PRN

## 2015-06-23 MED ORDER — CYCLOBENZAPRINE HCL 5 MG PO TABS: ORAL_TABLET | ORAL | Status: DC

## 2015-06-23 NOTE — Progress Notes (Signed)
Subjective:  This chart was scribed for Merri Ray, MD by Moises Blood, Medical Scribe. This patient was seen in room 3 and the patient's care was started 2:30 PM.   Patient ID: Jorge Dyer, male    DOB: 1966-02-03, 49 y.o.   MRN: [id number]  HPI Jorge Dyer is a 49 y.o. male Pt complains of a pulled muscle in leg 4 days ago. He's running a marathon in 3 days. He's done half marathons in the past.  He's been training for the past 18 weeks and he been pushing it pretty hard. He was doing a 20 mile run 3 weeks ago, and felt a strain on the outside of the right leg. He did stretches and it worked out. He did 15 miles 2 weeks ago, and last week, he completed a 10 mile.   It hurts when sitting and laying down. He feels it when he sits in the car for a long time and it hurts when he lays down to sleep. When he stands, it is okay. He can feel it when he runs. He denies running this week. He couldn't sleep last night because it's been hurting so bad. He denies urinary incontinence, bowel incontinence, and back pain. He notes some pain radiating to his buttocks.   He's had h/o left sciatica with lumbar surgery 6 years ago. He states that symptoms feel different.  He states that prednisone upsets his stomach.   He's pre-diabetic and wants to stay healthy and lose some weight.  He works in Press photographer for [employer].   Patient Active Problem List   Diagnosis Date Noted  . Cholelithiasis with cholecystitis, chronic 02/28/2012  . Diabetes mellitus 10/30/2011   Past Medical History  Diagnosis Date  . Hyperlipidemia   . Gallstones   . Chronic kidney disease     h/o kidney stones  . Gout   . Diabetes mellitus     diet control  . 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 Back surgery    . Kidney stone surgery    . Appendectomy    . Spine surgery     No Known Allergies Prior to Admission medications   Medication Sig Start Date End Date Taking?  Authorizing Provider  allopurinol (ZYLOPRIM) 100 MG tablet Take 100 mg by mouth daily.   Yes Historical Provider, MD  levothyroxine (SYNTHROID, LEVOTHROID) 100 MCG tablet Take 100 mcg by mouth daily before breakfast.   Yes Historical Provider, MD  losartan-hydrochlorothiazide (HYZAAR) 100-25 MG per tablet daily. 02/23/12  Yes Historical Provider, MD  simvastatin (ZOCOR) 40 MG tablet Take 40 mg by mouth daily.   Yes Historical Provider, MD  tiopronin (THIOLA) 100 MG tablet Take 200 mg by mouth 2 (two) times daily. Pt states he takes six 100 mg daily   Yes Historical Provider, MD  Na Sulfate-K Sulfate-Mg Sulf SOLN (no substitutions)-TAKE AS DIRECTED. Patient not taking: Reported on 06/23/2015 12/07/14   Jerene Bears, MD   Social History   Social History  . Marital Status: Married    Spouse Name: N/A  . Number of Children: N/A  . Years of Education: N/A   Occupational History  . Not on file.   Social History Main Topics  . Smoking status: Never Smoker   . Smokeless tobacco: Never Used  . Alcohol Use: Yes     Comment: ocassionally  . Drug Use: No  . Sexual Activity: Yes   Other  Topics Concern  . Not on file   Social History Narrative   Review of Systems  Gastrointestinal: Negative for nausea, vomiting, diarrhea, constipation and blood in stool.  Genitourinary: Negative for dysuria and hematuria.  Musculoskeletal: Positive for myalgias (right leg). Negative for back pain and gait problem.  Skin: Negative for rash and wound.       Objective:   Physical Exam  Constitutional: He is oriented to person, place, and time. He appears well-developed and well-nourished. No distress.  HENT:  Head: Normocephalic and atraumatic.  Eyes: EOM are normal. Pupils are equal, round, and reactive to light.  Neck: Neck supple.  Cardiovascular: Normal rate.   Pulmonary/Chest: Effort normal. No respiratory distress.  Musculoskeletal: Normal range of motion.  Lumbar spine: full flexion 90 degrees,  full extension, equal lateral flexion, full ROM lumbar spine, pain not reproduced with rotation Some tenderness in his left sciatic notch Right hip: full ROM with internal and external rotation, negative faber, slight tightness with fadir, slight tenderness along upper quads laterally, slight tend along IT band, right hip trochanter nontender, no effusion, negative ober  Neurological: He is alert and oriented to person, place, and time. He displays no Babinski's sign on the right side. He displays no Babinski's sign on the left side.  Reflex Scores:      Patellar reflexes are 1+ on the right side and 1+ on the left side.      Achilles reflexes are 1+ on the right side and 1+ on the left side. Negative straight leg raise  Skin: Skin is warm and dry.  Psychiatric: He has a normal mood and affect. His behavior is normal.  Nursing note and vitals reviewed.   Filed Vitals:   06/23/15 1410  BP: 120/80  Pulse: 69  Temp: 97.6 F (36.4 C)  TempSrc: Oral  Resp: 20  Height: 6' (1.829 m)  Weight: 274 lb 9.6 oz (124.558 kg)  SpO2: 99%   [facility] reading (PRIMARY) by Dr. Carlota Raspberry : xray right hip: minimal degenerative changes, no apparent fracture or acute bony findings.  Results for orders placed or performed in visit on 06/23/15  CK  Result Value Ref Range   Total CK 225 7 - 232 U/L       Assessment & Plan:   Jorge Dyer is a 49 y.o. male Pain in joint, pelvic region and thigh, right - Plan: CK, DG HIP UNILAT W OR W/O PELVIS 2-3 VIEWS RIGHT, meloxicam (MOBIC) 7.5 MG tablet, cyclobenzaprine (FLEXERIL) 5 MG tablet  Right leg pain - Plan: CK, DG HIP UNILAT W OR W/O PELVIS 2-3 VIEWS RIGHT, meloxicam (MOBIC) 7.5 MG tablet, cyclobenzaprine (FLEXERIL) 5 MG tablet  Piriformis syndrome of right side - Plan: meloxicam (MOBIC) 7.5 MG tablet, cyclobenzaprine (FLEXERIL) 5 MG tablet  Suspected combo of piriformis syndrome, possible radicular pain from degenerative lumbar disease, quad strain.    -continue piriformis stretches, it band stretches, short course of Mobic, and flexeril if needed. Will check CK, but doubt rhabdo or acute myopathy.   -rtc precautions and plan discussed during run if return or worsening of symptoms.      Meds ordered this encounter  Medications  . meloxicam (MOBIC) 7.5 MG tablet    Sig: Take 1 tablet (7.5 mg total) by mouth daily as needed for pain.    Dispense:  15 tablet    Refill:  0  . cyclobenzaprine (FLEXERIL) 5 MG tablet    Sig: 1 pill by mouth up to every 8  hours as needed.    Dispense:  15 tablet    Refill:  0   Patient Instructions   Your leg symptoms may be a combination of tight IT band, strain or pull of your quadriceps muscle, but more likely piriformis syndrome.  With degenerative changes of the back, this can also be a radiating pain from a pinched nerve.  Continued IT band stretches and  piriformis stretches.  You can try the meloxicam once per day as needed for inflammation, do not combine with Advil, Alleve or other NSAIDs over-the-counter. Flexeril up to every 8 hours as a muscle relaxant. This does cause sedation so should not drive or operate machinery while taking this medicine. Would avoid taking either one of these on your day of the race, Tylenol is okay if needed. If you are having pain in that leg that is increasing with the run, would walk or stop the race based on severity of your symptoms. If there are any concerns with the muscle enzyme tests, I will achieved the next few days.      By signing my name below, I, Moises Blood, attest that this documentation has been prepared under the direction and in the presence of Merri Ray, MD. Electronically Signed: Moises Blood, [place]. 06/23/2015 , 2:30 PM .  I personally performed the services described in this documentation, which was scribed in my presence. The recorded information has been reviewed and considered, and addended by me as needed.

## 2015-06-23 NOTE — Patient Instructions (Signed)
Your leg symptoms may be a combination of tight IT band, strain or pull of your quadriceps muscle, but more likely piriformis syndrome.  With degenerative changes of the back, this can also be a radiating pain from a pinched nerve.  Continued IT band stretches and  piriformis stretches.  You can try the meloxicam once per day as needed for inflammation, do not combine with Advil, Alleve or other NSAIDs over-the-counter. Flexeril up to every 8 hours as a muscle relaxant. This does cause sedation so should not drive or operate machinery while taking this medicine. Would avoid taking either one of these on your day of the race, Tylenol is okay if needed. If you are having pain in that leg that is increasing with the run, would walk or stop the race based on severity of your symptoms. If there are any concerns with the muscle enzyme tests, I will achieved the next few days.

## 2015-06-24 LAB — CK: Total CK: 225 U/L (ref 7–232)

## 2015-09-01 ENCOUNTER — Ambulatory Visit (INDEPENDENT_AMBULATORY_CARE_PROVIDER_SITE_OTHER): Payer: BLUE CROSS/BLUE SHIELD | Admitting: Family Medicine

## 2015-09-01 VITALS — BP 140/80 | HR 81 | Temp 97.9°F | Resp 17 | Ht 72.5 in | Wt 273.0 lb

## 2015-09-01 DIAGNOSIS — J0110 Acute frontal sinusitis, unspecified: Secondary | ICD-10-CM | POA: Diagnosis not present

## 2015-09-01 MED ORDER — AMOXICILLIN-POT CLAVULANATE 875-125 MG PO TABS: 1.0000 | ORAL_TABLET | Freq: Two times a day (BID) | ORAL | Status: DC

## 2015-09-01 MED ORDER — PREDNISONE 10 MG PO TABS: ORAL_TABLET | ORAL | Status: DC

## 2015-09-01 MED ORDER — HYDROCOD POLST-CPM POLST ER 10-8 MG/5ML PO SUER: 5.0000 mL | Freq: Two times a day (BID) | ORAL | Status: DC | PRN

## 2015-09-01 NOTE — Patient Instructions (Signed)

## 2015-09-01 NOTE — Progress Notes (Signed)
Subjective:  By signing my name below, I, Moises Blood, attest that this documentation has been prepared under the direction and in the presence of Delman Cheadle, MD. Electronically Signed: Moises Blood, [place]. 09/01/2015 , 10:23 AM .  Patient was seen in Room 10 .   Patient ID: Jorge Dyer, male    DOB: May 09, 1966, 50 y.o.   MRN: [id number] Chief Complaint  Patient presents with  . Cough    X 1 week  . Sore Throat    X 1 week  . Sinus Congestion    X 1 week   HPI Jorge Dyer is a 50 y.o. male who presents to [facility] complaining of cough, sore throat and sinus congestion that's been going on for about a week now. He tried Copywriter, advertising plus without relief. He has some chills, frontal sinus pressure, and rhinorrhea. He's been able to eat and drink regularly. He did wake up a few times at night due to coughs. He denies history of smoking. He denies any abx allergies and any recent abx use. His son has an ear infection, but otherwise no sick contacts at home. He denies fever.   Past Medical History  Diagnosis Date  . Hyperlipidemia   . Gallstones   . Chronic kidney disease     h/o kidney stones  . Gout   . Diabetes mellitus     diet control  . 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 Authorizing Provider  allopurinol (ZYLOPRIM) 100 MG tablet Take 100 mg by mouth daily.   Yes Historical Provider, MD  levothyroxine (SYNTHROID, LEVOTHROID) 100 MCG tablet Take 100 mcg by mouth daily before breakfast.   Yes Historical Provider, MD  losartan-hydrochlorothiazide (HYZAAR) 100-25 MG per tablet daily. 02/23/12  Yes Historical Provider, MD  simvastatin (ZOCOR) 40 MG tablet Take 40 mg by mouth daily.   Yes Historical Provider, MD  tiopronin (THIOLA) 100 MG tablet Take 200 mg by mouth 2 (two) times daily. Pt states he takes six 100 mg daily   Yes Historical Provider, MD  cyclobenzaprine (FLEXERIL) 5 MG tablet 1 pill by mouth up to every 8  hours as needed. Patient not taking: Reported on 09/01/2015 06/23/15   Wendie Agreste, MD  meloxicam (MOBIC) 7.5 MG tablet Take 1 tablet (7.5 mg total) by mouth daily as needed for pain. Patient not taking: Reported on 09/01/2015 06/23/15   Wendie Agreste, MD  Na Sulfate-K Sulfate-Mg Sulf SOLN (no substitutions)-TAKE AS DIRECTED. Patient not taking: Reported on 06/23/2015 12/07/14   Jerene Bears, MD   No Known Allergies  Review of Systems  Constitutional: Positive for chills. Negative for fever, appetite change and fatigue.  HENT: Positive for congestion, postnasal drip, rhinorrhea, sinus pressure and sore throat. Negative for trouble swallowing.   Respiratory: Positive for cough. Negative for shortness of breath and wheezing.   Cardiovascular: Negative for chest pain.       Objective:   Physical Exam  Constitutional: He is oriented to person, place, and time. He appears well-developed and well-nourished. No distress.  HENT:  Head: Normocephalic and atraumatic.  Right Ear: Tympanic membrane is injected and retracted.  Left Ear: Tympanic membrane is injected and retracted.  Nose: Rhinorrhea (purulent) present.  Mouth/Throat: Oropharynx is clear and moist.  Eyes: EOM are normal. Pupils are equal, round, and reactive to light.  Neck: Neck supple. No thyromegaly present.  Cardiovascular: Normal rate.   Pulmonary/Chest:  Effort normal. No respiratory distress.  Musculoskeletal: Normal range of motion.  Lymphadenopathy:    He has no cervical adenopathy.  Neurological: He is alert and oriented to person, place, and time.  Skin: Skin is warm and dry.  Psychiatric: He has a normal mood and affect. His behavior is normal.  Nursing note and vitals reviewed.   BP 140/80 mmHg  Pulse 81  Temp(Src) 97.9 F (36.6 C) (Oral)  Resp 17  Ht 6' 0.5" (1.842 m)  Wt 273 lb (123.832 kg)  BMI 36.50 kg/m2  SpO2 99%     Assessment & Plan:   1. Acute frontal sinusitis, recurrence not specified      Meds ordered this encounter  Medications  . amoxicillin-clavulanate (AUGMENTIN) 875-125 MG tablet    Sig: Take 1 tablet by mouth 2 (two) times daily.    Dispense:  20 tablet    Refill:  0  . predniSONE (DELTASONE) 10 MG tablet    Sig: 6-5-4-3-2-1 tabs po qd    Dispense:  21 tablet    Refill:  0  . chlorpheniramine-HYDROcodone (TUSSIONEX PENNKINETIC ER) 10-8 MG/5ML SUER    Sig: Take 5 mLs by mouth every 12 (twelve) hours as needed.    Dispense:  120 mL    Refill:  0    I personally performed the services described in this documentation, which was scribed in my presence. The recorded information has been reviewed and considered, and addended by me as needed.  Delman Cheadle, MD MPH

## 2016-01-12 DIAGNOSIS — N200 Calculus of kidney: Secondary | ICD-10-CM | POA: Diagnosis not present

## 2016-01-12 DIAGNOSIS — N261 Atrophy of kidney (terminal): Secondary | ICD-10-CM | POA: Diagnosis not present

## 2016-05-26 DIAGNOSIS — E038 Other specified hypothyroidism: Secondary | ICD-10-CM | POA: Diagnosis not present

## 2016-05-26 DIAGNOSIS — E1165 Type 2 diabetes mellitus with hyperglycemia: Secondary | ICD-10-CM | POA: Diagnosis not present

## 2016-05-26 DIAGNOSIS — I10 Essential (primary) hypertension: Secondary | ICD-10-CM | POA: Diagnosis not present

## 2016-05-26 DIAGNOSIS — E784 Other hyperlipidemia: Secondary | ICD-10-CM | POA: Diagnosis not present

## 2016-05-26 DIAGNOSIS — N183 Chronic kidney disease, stage 3 (moderate): Secondary | ICD-10-CM | POA: Diagnosis not present

## 2016-05-29 ENCOUNTER — Encounter: Payer: Self-pay | Admitting: Internal Medicine

## 2016-07-10 DIAGNOSIS — M9903 Segmental and somatic dysfunction of lumbar region: Secondary | ICD-10-CM | POA: Diagnosis not present

## 2016-07-10 DIAGNOSIS — M5137 Other intervertebral disc degeneration, lumbosacral region: Secondary | ICD-10-CM | POA: Diagnosis not present

## 2016-07-10 DIAGNOSIS — M9902 Segmental and somatic dysfunction of thoracic region: Secondary | ICD-10-CM | POA: Diagnosis not present

## 2016-07-10 DIAGNOSIS — M5431 Sciatica, right side: Secondary | ICD-10-CM | POA: Diagnosis not present

## 2016-07-11 DIAGNOSIS — M9902 Segmental and somatic dysfunction of thoracic region: Secondary | ICD-10-CM | POA: Diagnosis not present

## 2016-07-11 DIAGNOSIS — M9903 Segmental and somatic dysfunction of lumbar region: Secondary | ICD-10-CM | POA: Diagnosis not present

## 2016-07-11 DIAGNOSIS — M5137 Other intervertebral disc degeneration, lumbosacral region: Secondary | ICD-10-CM | POA: Diagnosis not present

## 2016-07-11 DIAGNOSIS — M5431 Sciatica, right side: Secondary | ICD-10-CM | POA: Diagnosis not present

## 2016-07-12 ENCOUNTER — Ambulatory Visit (AMBULATORY_SURGERY_CENTER): Payer: Self-pay | Admitting: *Deleted

## 2016-07-12 VITALS — Ht 72.0 in | Wt 258.0 lb

## 2016-07-12 DIAGNOSIS — Z1211 Encounter for screening for malignant neoplasm of colon: Secondary | ICD-10-CM

## 2016-07-12 MED ORDER — NA SULFATE-K SULFATE-MG SULF 17.5-3.13-1.6 GM/177ML PO SOLN: 1.0000 | Freq: Once | ORAL | 0 refills | Status: AC

## 2016-07-12 NOTE — Progress Notes (Signed)
No egg or soy allergy known to patient  No issues with past sedation with any surgeries  or procedures, no intubation problems  No diet pills per patient- stopped phentermine  No home 02 use per patient  No blood thinners per patient  Pt denies issues with constipation  No A fib or A flutter  emmi video declined

## 2016-07-13 DIAGNOSIS — M9903 Segmental and somatic dysfunction of lumbar region: Secondary | ICD-10-CM | POA: Diagnosis not present

## 2016-07-13 DIAGNOSIS — M9902 Segmental and somatic dysfunction of thoracic region: Secondary | ICD-10-CM | POA: Diagnosis not present

## 2016-07-13 DIAGNOSIS — M5137 Other intervertebral disc degeneration, lumbosacral region: Secondary | ICD-10-CM | POA: Diagnosis not present

## 2016-07-13 DIAGNOSIS — M5431 Sciatica, right side: Secondary | ICD-10-CM | POA: Diagnosis not present

## 2016-07-17 DIAGNOSIS — M5137 Other intervertebral disc degeneration, lumbosacral region: Secondary | ICD-10-CM | POA: Diagnosis not present

## 2016-07-17 DIAGNOSIS — M9903 Segmental and somatic dysfunction of lumbar region: Secondary | ICD-10-CM | POA: Diagnosis not present

## 2016-07-17 DIAGNOSIS — M5431 Sciatica, right side: Secondary | ICD-10-CM | POA: Diagnosis not present

## 2016-07-17 DIAGNOSIS — M9902 Segmental and somatic dysfunction of thoracic region: Secondary | ICD-10-CM | POA: Diagnosis not present

## 2016-07-17 DIAGNOSIS — M545 Low back pain: Secondary | ICD-10-CM | POA: Diagnosis not present

## 2016-07-19 DIAGNOSIS — M9902 Segmental and somatic dysfunction of thoracic region: Secondary | ICD-10-CM | POA: Diagnosis not present

## 2016-07-19 DIAGNOSIS — M5137 Other intervertebral disc degeneration, lumbosacral region: Secondary | ICD-10-CM | POA: Diagnosis not present

## 2016-07-19 DIAGNOSIS — M9903 Segmental and somatic dysfunction of lumbar region: Secondary | ICD-10-CM | POA: Diagnosis not present

## 2016-07-19 DIAGNOSIS — M5431 Sciatica, right side: Secondary | ICD-10-CM | POA: Diagnosis not present

## 2016-07-24 DIAGNOSIS — M5431 Sciatica, right side: Secondary | ICD-10-CM | POA: Diagnosis not present

## 2016-07-26 ENCOUNTER — Encounter: Payer: BLUE CROSS/BLUE SHIELD | Admitting: Internal Medicine

## 2016-07-27 DIAGNOSIS — M5431 Sciatica, right side: Secondary | ICD-10-CM | POA: Diagnosis not present

## 2016-07-31 DIAGNOSIS — M545 Low back pain: Secondary | ICD-10-CM | POA: Diagnosis not present

## 2016-07-31 DIAGNOSIS — M5431 Sciatica, right side: Secondary | ICD-10-CM | POA: Diagnosis not present

## 2016-07-31 DIAGNOSIS — Z01812 Encounter for preprocedural laboratory examination: Secondary | ICD-10-CM | POA: Diagnosis not present

## 2016-08-01 DIAGNOSIS — M5431 Sciatica, right side: Secondary | ICD-10-CM | POA: Diagnosis not present

## 2016-08-02 DIAGNOSIS — M5431 Sciatica, right side: Secondary | ICD-10-CM | POA: Diagnosis not present

## 2016-08-02 DIAGNOSIS — M545 Low back pain: Secondary | ICD-10-CM | POA: Diagnosis not present

## 2016-08-03 ENCOUNTER — Ambulatory Visit: Payer: Self-pay | Admitting: Orthopedic Surgery

## 2016-08-03 NOTE — H&P (Signed)
Jorge Dyer is an 50 y.o. male.   Chief Complaint: back and leg pain HPI: The patient is a 50 year old male who presents today for follow up of their back. The patient is being followed for their low back symptoms. They are now 7 years, 1 1/2 months out from lumbar decompression. The patient is 4 weeks out from a flare up. Symptoms reported today include: numbness (right foot), leg pain and pain with standing. Current treatment includes: physical therapy, activity modification and pain medications. The following medication has been used for pain control: Oxycodone (at night) and Tylenol (during the day). The patient presents today following MRI.  Jorge Dyer follows up with his MRI. He has a large disc herniation L5-S1 to the right displacing the S1 nerve root. He has another disc herniation at 3-4 to the left. He has no left-sided symptoms. It is all right posterior down to the outer aspect of his foot.    Past Medical History:  Diagnosis Date  . Chronic kidney disease    h/o kidney stones  . 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 Diabetes mellitus    diet control  . Gallstones   . Gout   . Hyperlipidemia   . Thyroid disease     Past Surgical History:  Procedure Laterality Date  . APPENDECTOMY    . BACK SURGERY    . KIDNEY STONE SURGERY    . SPINE SURGERY      Family History  Problem Relation Age of Onset  . Colon cancer Neg Hx   . Colon polyps Neg Hx   . Esophageal cancer Neg Hx   . Rectal cancer Neg Hx   . Stomach cancer Neg Hx    Social History:  reports that he has never smoked. He has never used smokeless tobacco. He reports that he drinks alcohol. He reports that he does not use drugs.  Allergies: No Known Allergies   (Not in a hospital admission)  No results found for this or any previous visit (from the past 48 hour(s)). No results found.  Review of Systems  Constitutional: Negative.   HENT: Negative.   Eyes: Negative.   Respiratory: Negative.   Cardiovascular: Negative.    Gastrointestinal: Negative.   Genitourinary: Negative.   Musculoskeletal: Positive for back pain.  Skin: Negative.   Neurological: Positive for sensory change and focal weakness.  Psychiatric/Behavioral: Negative.     There were no vitals taken for this visit. Physical Exam  Constitutional: He is oriented to person, place, and time. He appears well-developed.  HENT:  Head: Normocephalic.  Eyes: Pupils are equal, round, and reactive to light.  Neck: Normal range of motion.  Cardiovascular: Normal rate.   Respiratory: Effort normal.  GI: Soft.  Musculoskeletal:   On exam, he is in moderate distress, walks with an antalgic gait. Straight leg raises buttock, thigh and calf pain on the right, negative on left. EHL is 4+/5 and diminished plantar flexion, altered sensation in S1 dermatome, decreased Achilles reflex on the right. Left, he has good quad strength. Negative straight leg raise. Negative femoral stretch. No instability in hips, knees and ankles.  Neurological: He is alert and oriented to person, place, and time.    MRI, paracentral disc herniation and large L5-S1 to the right displacing the S1 nerve root. There is a paracentral disc protrusion at 3-4. He has a previous decompression at 3-4 and 4-5.  Assessment/Plan 1. 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 2. Asymptomatic disc herniation at 3-4. 3. History of lumbar decompression 3-4, 4-5.  We discussed options. Continue physical therapy, epidural. He does not want to proceed with epidural. He would like to proceed with lumbar decompression. It has been over 10 weeks of symptoms of severe pain limiting his activity, given the presence of neurologic deficit and the large neurocompressive lesion it is reasonable to proceed with lumbar decompression. I had an extensive discussion of the  risks and benefits of the lumbar decompression with the patient including bleeding, infection, damage to neurovascular structures, epidural fibrosis, CSF leak requiring repair. We also discussed increase in pain, adjacent segment disease, recurrent disc herniation, need for future surgery including repeat decompression and/or fusion. We also discussed risks of postoperative hematoma, paralysis, anesthetic complications including DVT, PE, death, cardiopulmonary dysfunction. In addition, the perioperative and postoperative courses were discussed in detail including the rehabilitative time and return to functional activity and work. I provided the patient with an illustrated handout and utilized the appropriate surgical models. I will see him. We try to get that scheduled as soon as possible. He would like to have that before the end of the year. Analgesics in the interim.  I had an extensive discussion of the risks and benefits of the lumbar decompression with the patient including bleeding, infection, damage to neurovascular structures, epidural fibrosis, CSF leak requiring repair. We also discussed increase in pain, adjacent segment disease, recurrent disc herniation, need for future surgery including repeat decompression and/or fusion. We also discussed risks of postoperative hematoma, paralysis, anesthetic complications including DVT, PE, death, cardiopulmonary dysfunction. In addition, the perioperative and postoperative courses were discussed in detail including the rehabilitative time and return to functional activity and work. I provided the patient with an illustrated handout and utilized the appropriate surgical models.  Cecilie Kicks., PA-C for Dr. Tonita Cong 08/03/2016, 4:38 PM

## 2016-08-03 NOTE — Progress Notes (Signed)
Pt is being scheduled for preop appt; please place surgical orders in epic. Thanks.  

## 2016-08-03 NOTE — Patient Instructions (Addendum)
Jorge Dyer  08/03/2016   Your procedure is scheduled on: Friday 08/11/2016  Report to [facility]  Entrance take [facility]  elevators to 3rd floor to  [place] at  100 PM.  Call this number if you have problems the morning of surgery [phone]   Remember: ONLY 1 PERSON MAY GO WITH YOU TO SHORT STAY TO GET  READY MORNING OF [place].   Do not eat food  :After Midnight. MAY HAVE CLEAR LIQUIDS FROM MIDNIGHT UP UNTIL 0900 AM THEN NOTHING UNTIL AFTER SURGERY!     CLEAR LIQUID DIET   Foods Allowed                                                                     Foods Excluded  Coffee and tea, regular and decaf                             liquids that you cannot  Plain Jell-O in any flavor                                             see through such as: Fruit ices (not with fruit pulp)                                     milk, soups, orange juice  Iced Popsicles                                    All solid food Carbonated beverages, regular and diet                                    Cranberry, grape and apple juices Sports drinks like Gatorade Lightly seasoned clear broth or consume(fat free) Sugar, honey syrup  Sample Menu Breakfast                                Lunch                                     Supper Cranberry juice                    Beef broth                            Chicken broth Jell-O                                     Grape juice  Apple juice Coffee or tea                        Jell-O                                      Popsicle                                                Coffee or tea                        Coffee or tea  _____________________________________________________________________     Take these medicines the morning of surgery with A SIP OF WATER: Levothyroxine, Topiramate (Topamax), Allupirinol, gabapentin (neurontin), Thiola  How to Manage Your Diabetes Before and  After Surgery  Why is it important to control my blood sugar before and after surgery? . Improving blood sugar levels before and after surgery helps healing and can limit problems. . A way of improving blood sugar control is eating a healthy diet by: o  Eating less sugar and carbohydrates o  Increasing activity/exercise o  Talking with your doctor about reaching your blood sugar goals . High blood sugars (greater than 180 mg/dL) can raise your risk of infections and slow your recovery, so you will need to focus on controlling your diabetes during the weeks before surgery. . Make sure that the doctor who takes care of your diabetes knows about your planned surgery including the date and location.  How do I manage my blood sugar before surgery? . Check your blood sugar at least 4 times a day, starting 2 days before surgery, to make sure that the level is not too high or low. o Check your blood sugar the morning of your surgery when you wake up and every 2 hours until you get to the Short Stay unit. . If your blood sugar is less than 70 mg/dL, you will need to treat for low blood sugar: o Do not take insulin. o Treat a low blood sugar (less than 70 mg/dL) with  cup of clear juice (cranberry or apple), 4 glucose tablets, OR glucose gel. o Recheck blood sugar in 15 minutes after treatment (to make sure it is greater than 70 mg/dL). If your blood sugar is not greater than 70 mg/dL on recheck, call [phone] for further instructions. . Report your blood sugar to the short stay nurse when you get to Short Stay.  . If you are admitted to the hospital after surgery: o Your blood sugar will be checked by the staff and you will probably be given insulin after surgery (instead of oral diabetes medicines) to make sure you have good blood sugar levels. o The goal for blood sugar control after surgery is 80-180 mg/dL.   WHAT DO I DO ABOUT MY DIABETES MEDICATION?  Marland Kitchen Do not take oral diabetes medicines  (pills) the morning of surgery.   . The day of surgery, do not take other diabetes injectables, including Byetta (exenatide), Bydureon (exenatide ER), Victoza (liraglutide), or Trulicity (dulaglutide).  . May take Trulicity (dulaglutide) the day BEFORE surgery on 08/10/2016.  You may not have any metal on your body including hair pins and              piercings  Do not wear jewelry, make-up, lotions, powders or perfumes, deodorant             Do not wear nail polish.  Do not shave  48 hours prior to surgery.              Men may shave face and neck.   Do not bring valuables to the hospital. [place].  Contacts, dentures or bridgework may not be worn into surgery.  Leave suitcase in the car. After surgery it may be brought to your room.                  Please read over the following fact sheets you were given: _____________________________________________________________________             [facility] - Preparing for Surgery Before surgery, you can play an important role.  Because skin is not sterile, your skin needs to be as free of germs as possible.  You can reduce the number of germs on your skin by washing with CHG (chlorahexidine gluconate) soap before surgery.  CHG is an antiseptic cleaner which kills germs and bonds with the skin to continue killing germs even after washing. Please DO NOT use if you have an allergy to CHG or antibacterial soaps.  If your skin becomes reddened/irritated stop using the CHG and inform your nurse when you arrive at Short Stay. Do not shave (including legs and underarms) for at least 48 hours prior to the first CHG shower.  You may shave your face/neck. Please follow these instructions carefully:  1.  Shower with CHG Soap the night before surgery and the  morning of Surgery.  2.  If you choose to wash your hair, wash your hair first as usual with your  normal   shampoo.  3.  After you shampoo, rinse your hair and body thoroughly to remove the  shampoo.                           4.  Use CHG as you would any other liquid soap.  You can apply chg directly  to the skin and wash                       Gently with a scrungie or clean washcloth.  5.  Apply the CHG Soap to your body ONLY FROM THE NECK DOWN.   Do not use on face/ open                           Wound or open sores. Avoid contact with eyes, ears mouth and genitals (private parts).                       Wash face,  Genitals (private parts) with your normal soap.             6.  Wash thoroughly, paying special attention to the area where your surgery  will be performed.  7.  Thoroughly rinse your body with warm water from the neck down.  8.  DO NOT shower/wash with your normal soap after using  and rinsing off  the CHG Soap.                9.  Pat yourself dry with a clean towel.            10.  Wear clean pajamas.            11.  Place clean sheets on your bed the night of your first shower and do not  sleep with pets. Day of Surgery : Do not apply any lotions/deodorants the morning of surgery.  Please wear clean clothes to the hospital/surgery center.  FAILURE TO FOLLOW THESE INSTRUCTIONS MAY RESULT IN THE CANCELLATION OF YOUR SURGERY PATIENT SIGNATURE_________________________________  NURSE SIGNATURE__________________________________  ________________________________________________________________________

## 2016-08-08 ENCOUNTER — Encounter (INDEPENDENT_AMBULATORY_CARE_PROVIDER_SITE_OTHER): Payer: Self-pay

## 2016-08-08 ENCOUNTER — Ambulatory Visit (HOSPITAL_COMMUNITY)
Admission: RE | Admit: 2016-08-08 | Discharge: 2016-08-08 | Disposition: A | Discharge status: Home / Self Care | Payer: BLUE CROSS/BLUE SHIELD | Source: Ambulatory Visit | Attending: Orthopedic Surgery | Admitting: Orthopedic Surgery

## 2016-08-08 ENCOUNTER — Encounter (HOSPITAL_COMMUNITY): Payer: Self-pay

## 2016-08-08 ENCOUNTER — Other Ambulatory Visit (HOSPITAL_COMMUNITY): Payer: Self-pay | Admitting: Emergency Medicine

## 2016-08-08 ENCOUNTER — Encounter (HOSPITAL_COMMUNITY)
Admission: RE | Admit: 2016-08-08 | Discharge: 2016-08-08 | Disposition: A | Discharge status: Home / Self Care | Payer: BLUE CROSS/BLUE SHIELD | Source: Ambulatory Visit | Attending: Specialist | Admitting: Specialist

## 2016-08-08 DIAGNOSIS — E119 Type 2 diabetes mellitus without complications: Secondary | ICD-10-CM | POA: Diagnosis not present

## 2016-08-08 DIAGNOSIS — M5126 Other intervertebral disc displacement, lumbar region: Secondary | ICD-10-CM | POA: Insufficient documentation

## 2016-08-08 DIAGNOSIS — M47816 Spondylosis without myelopathy or radiculopathy, lumbar region: Secondary | ICD-10-CM | POA: Diagnosis not present

## 2016-08-08 HISTORY — DX: Personal history of urinary calculi: Z87.442

## 2016-08-08 HISTORY — DX: Hypothyroidism, unspecified: E03.9

## 2016-08-08 HISTORY — DX: Reserved for concepts with insufficient information to code with codable children: IMO0002

## 2016-08-08 HISTORY — DX: Unspecified osteoarthritis, unspecified site: M19.90

## 2016-08-08 LAB — CBC
HCT: 42.5 % (ref 39.0–52.0)
Hemoglobin: 15 g/dL (ref 13.0–17.0)
MCH: 30.4 pg (ref 26.0–34.0)
MCHC: 35.3 g/dL (ref 30.0–36.0)
MCV: 86.2 fL (ref 78.0–100.0)
Platelets: 198 10*3/uL (ref 150–400)
RBC: 4.93 MIL/uL (ref 4.22–5.81)
RDW: 13.6 % (ref 11.5–15.5)
WBC: 6 10*3/uL (ref 4.0–10.5)

## 2016-08-08 LAB — BASIC METABOLIC PANEL
Anion gap: 9 (ref 5–15)
BUN: 32 mg/dL — ABNORMAL HIGH (ref 6–20)
CO2: 24 mmol/L (ref 22–32)
Calcium: 9.4 mg/dL (ref 8.9–10.3)
Chloride: 103 mmol/L (ref 101–111)
Creatinine, Ser: 1.9 mg/dL — ABNORMAL HIGH (ref 0.61–1.24)
GFR calc Af Amer: 46 mL/min — ABNORMAL LOW (ref >60)
GFR calc non Af Amer: 39 mL/min — ABNORMAL LOW (ref >60)
Glucose, Bld: 194 mg/dL — ABNORMAL HIGH (ref 65–99)
Potassium: 4.1 mmol/L (ref 3.5–5.1)
Sodium: 136 mmol/L (ref 135–145)

## 2016-08-08 LAB — ABO/RH: ABO/RH(D): A POS

## 2016-08-08 LAB — SURGICAL PCR SCREEN
MRSA, PCR: NEGATIVE
Staphylococcus aureus: NEGATIVE

## 2016-08-08 LAB — GLUCOSE, CAPILLARY: Glucose-Capillary: 185 mg/dL — ABNORMAL HIGH (ref 65–99)

## 2016-08-08 NOTE — Progress Notes (Signed)
Bmet results faxed to Dr Tonita Cong by [facility]

## 2016-08-08 NOTE — Progress Notes (Signed)
lov dr Marton Redwood 08-03-16 on chart

## 2016-08-09 LAB — HEMOGLOBIN A1C
Hgb A1c MFr Bld: 6.9 % — ABNORMAL HIGH (ref 4.8–5.6)
Mean Plasma Glucose: 151 mg/dL

## 2016-08-11 ENCOUNTER — Encounter (HOSPITAL_COMMUNITY): Payer: Self-pay | Admitting: *Deleted

## 2016-08-11 ENCOUNTER — Ambulatory Visit (HOSPITAL_COMMUNITY): Payer: BLUE CROSS/BLUE SHIELD

## 2016-08-11 ENCOUNTER — Ambulatory Visit (HOSPITAL_COMMUNITY)
Admission: RE | Admit: 2016-08-11 | Discharge: 2016-08-12 | Disposition: A | Discharge status: Home / Self Care | Payer: BLUE CROSS/BLUE SHIELD | Source: Ambulatory Visit | Attending: Specialist | Admitting: Specialist

## 2016-08-11 ENCOUNTER — Encounter (HOSPITAL_COMMUNITY)
Admission: RE | Disposition: A | Discharge status: Home / Self Care | Payer: Self-pay | Source: Ambulatory Visit | Attending: Specialist

## 2016-08-11 ENCOUNTER — Ambulatory Visit (HOSPITAL_COMMUNITY): Payer: BLUE CROSS/BLUE SHIELD | Admitting: Registered Nurse

## 2016-08-11 DIAGNOSIS — E7201 Cystinuria: Secondary | ICD-10-CM | POA: Insufficient documentation

## 2016-08-11 DIAGNOSIS — I10 Essential (primary) hypertension: Secondary | ICD-10-CM | POA: Insufficient documentation

## 2016-08-11 DIAGNOSIS — Z981 Arthrodesis status: Secondary | ICD-10-CM | POA: Diagnosis not present

## 2016-08-11 DIAGNOSIS — M48061 Spinal stenosis, lumbar region without neurogenic claudication: Secondary | ICD-10-CM | POA: Diagnosis present

## 2016-08-11 DIAGNOSIS — Z9049 Acquired absence of other specified parts of digestive tract: Secondary | ICD-10-CM | POA: Insufficient documentation

## 2016-08-11 DIAGNOSIS — M5127 Other intervertebral disc displacement, lumbosacral region: Secondary | ICD-10-CM | POA: Diagnosis not present

## 2016-08-11 DIAGNOSIS — E039 Hypothyroidism, unspecified: Secondary | ICD-10-CM | POA: Diagnosis not present

## 2016-08-11 DIAGNOSIS — Z87442 Personal history of urinary calculi: Secondary | ICD-10-CM | POA: Diagnosis not present

## 2016-08-11 DIAGNOSIS — Z79899 Other long term (current) drug therapy: Secondary | ICD-10-CM | POA: Insufficient documentation

## 2016-08-11 DIAGNOSIS — Z79891 Long term (current) use of opiate analgesic: Secondary | ICD-10-CM | POA: Diagnosis not present

## 2016-08-11 DIAGNOSIS — Z419 Encounter for procedure for purposes other than remedying health state, unspecified: Secondary | ICD-10-CM

## 2016-08-11 DIAGNOSIS — M5116 Intervertebral disc disorders with radiculopathy, lumbar region: Secondary | ICD-10-CM | POA: Insufficient documentation

## 2016-08-11 DIAGNOSIS — Z9889 Other specified postprocedural states: Secondary | ICD-10-CM | POA: Insufficient documentation

## 2016-08-11 DIAGNOSIS — Z794 Long term (current) use of insulin: Secondary | ICD-10-CM | POA: Insufficient documentation

## 2016-08-11 DIAGNOSIS — M4807 Spinal stenosis, lumbosacral region: Secondary | ICD-10-CM | POA: Diagnosis not present

## 2016-08-11 DIAGNOSIS — M47816 Spondylosis without myelopathy or radiculopathy, lumbar region: Secondary | ICD-10-CM | POA: Diagnosis not present

## 2016-08-11 DIAGNOSIS — E119 Type 2 diabetes mellitus without complications: Secondary | ICD-10-CM | POA: Insufficient documentation

## 2016-08-11 DIAGNOSIS — M109 Gout, unspecified: Secondary | ICD-10-CM | POA: Diagnosis not present

## 2016-08-11 DIAGNOSIS — M4726 Other spondylosis with radiculopathy, lumbar region: Secondary | ICD-10-CM | POA: Diagnosis not present

## 2016-08-11 DIAGNOSIS — M5126 Other intervertebral disc displacement, lumbar region: Secondary | ICD-10-CM

## 2016-08-11 HISTORY — PX: DECOMPRESSIVE LUMBAR LAMINECTOMY LEVEL 1: SHX5791

## 2016-08-11 LAB — GLUCOSE, CAPILLARY
Glucose-Capillary: 117 mg/dL — ABNORMAL HIGH (ref 65–99)
Glucose-Capillary: 146 mg/dL — ABNORMAL HIGH (ref 65–99)
Glucose-Capillary: 229 mg/dL — ABNORMAL HIGH (ref 65–99)

## 2016-08-11 LAB — TYPE AND SCREEN
ABO/RH(D): A POS
Antibody Screen: NEGATIVE

## 2016-08-11 SURGERY — DECOMPRESSIVE LUMBAR LAMINECTOMY LEVEL 1
Anesthesia: General | Site: Back | Laterality: Right

## 2016-08-11 MED ORDER — INSULIN ASPART 100 UNIT/ML ~~LOC~~ SOLN: 0.0000 [IU] | Freq: Three times a day (TID) | SUBCUTANEOUS | Status: DC

## 2016-08-11 MED ORDER — ROCURONIUM BROMIDE 100 MG/10ML IV SOLN
INTRAVENOUS | Status: DC | PRN
  Administered 2016-08-11: 10 mg via INTRAVENOUS
  Administered 2016-08-11: 5 mg via INTRAVENOUS
  Administered 2016-08-11: 50 mg via INTRAVENOUS

## 2016-08-11 MED ORDER — LOSARTAN POTASSIUM 50 MG PO TABS: 100.0000 mg | ORAL_TABLET | Freq: Every day | ORAL | Status: DC

## 2016-08-11 MED ORDER — ONDANSETRON HCL 4 MG/2ML IJ SOLN: 4.0000 mg | INTRAMUSCULAR | Status: DC | PRN

## 2016-08-11 MED ORDER — ONDANSETRON HCL 4 MG/2ML IJ SOLN
INTRAMUSCULAR | Status: AC
  Filled 2016-08-11: qty 2

## 2016-08-11 MED ORDER — MEPERIDINE HCL 50 MG/ML IJ SOLN: 6.2500 mg | INTRAMUSCULAR | Status: DC | PRN

## 2016-08-11 MED ORDER — SUCCINYLCHOLINE CHLORIDE 200 MG/10ML IV SOSY
PREFILLED_SYRINGE | INTRAVENOUS | Status: AC
  Filled 2016-08-11: qty 10

## 2016-08-11 MED ORDER — LIDOCAINE-EPINEPHRINE (PF) 1 %-1:200000 IJ SOLN
INTRAMUSCULAR | Status: AC
  Filled 2016-08-11: qty 30

## 2016-08-11 MED ORDER — HYDROCHLOROTHIAZIDE 25 MG PO TABS: 25.0000 mg | ORAL_TABLET | Freq: Every day | ORAL | Status: DC

## 2016-08-11 MED ORDER — DULAGLUTIDE 1.5 MG/0.5ML ~~LOC~~ SOAJ: 1.5000 mg | SUBCUTANEOUS | Status: DC

## 2016-08-11 MED ORDER — KCL IN DEXTROSE-NACL 20-5-0.45 MEQ/L-%-% IV SOLN
INTRAVENOUS | Status: DC
  Administered 2016-08-11: 22:00:00 via INTRAVENOUS
  Filled 2016-08-11: qty 1000

## 2016-08-11 MED ORDER — SODIUM CHLORIDE 0.9 % IR SOLN
Status: DC | PRN
  Administered 2016-08-11: 500 mL

## 2016-08-11 MED ORDER — CEFAZOLIN SODIUM-DEXTROSE 2-3 GM-% IV SOLR
INTRAVENOUS | Status: DC | PRN
  Administered 2016-08-11: 2 g via INTRAVENOUS

## 2016-08-11 MED ORDER — MENTHOL 3 MG MT LOZG: 1.0000 | LOZENGE | OROMUCOSAL | Status: DC | PRN

## 2016-08-11 MED ORDER — METHOCARBAMOL 1000 MG/10ML IJ SOLN
500.0000 mg | Freq: Four times a day (QID) | INTRAVENOUS | Status: DC | PRN
  Administered 2016-08-11: 500 mg via INTRAVENOUS
  Filled 2016-08-11: qty 5
  Filled 2016-08-11: qty 550

## 2016-08-11 MED ORDER — LIDOCAINE 2% (20 MG/ML) 5 ML SYRINGE
INTRAMUSCULAR | Status: AC
  Filled 2016-08-11: qty 5

## 2016-08-11 MED ORDER — ONDANSETRON HCL 4 MG/2ML IJ SOLN
INTRAMUSCULAR | Status: DC | PRN
  Administered 2016-08-11: 4 mg via INTRAVENOUS

## 2016-08-11 MED ORDER — ACETAMINOPHEN 650 MG RE SUPP
650.0000 mg | RECTAL | Status: DC | PRN
Start: 2016-08-11 — End: 2016-08-12

## 2016-08-11 MED ORDER — HYDROCODONE-ACETAMINOPHEN 5-325 MG PO TABS
1.0000 | ORAL_TABLET | ORAL | Status: DC | PRN
  Administered 2016-08-11: 2 via ORAL
  Filled 2016-08-11: qty 2

## 2016-08-11 MED ORDER — BISACODYL 5 MG PO TBEC: 5.0000 mg | DELAYED_RELEASE_TABLET | Freq: Every day | ORAL | Status: DC | PRN

## 2016-08-11 MED ORDER — PHENOL 1.4 % MT LIQD: 1.0000 | OROMUCOSAL | Status: DC | PRN

## 2016-08-11 MED ORDER — SUGAMMADEX SODIUM 500 MG/5ML IV SOLN
INTRAVENOUS | Status: AC
  Filled 2016-08-11: qty 5

## 2016-08-11 MED ORDER — HYDROMORPHONE HCL 1 MG/ML IJ SOLN: 0.2500 mg | INTRAMUSCULAR | Status: DC | PRN

## 2016-08-11 MED ORDER — METHOCARBAMOL 500 MG PO TABS
500.0000 mg | ORAL_TABLET | Freq: Four times a day (QID) | ORAL | Status: DC | PRN
Start: 2016-08-11 — End: 2016-08-12

## 2016-08-11 MED ORDER — TOPIRAMATE 25 MG PO TABS
50.0000 mg | ORAL_TABLET | Freq: Every day | ORAL | Status: DC
  Filled 2016-08-11: qty 2

## 2016-08-11 MED ORDER — FENTANYL CITRATE (PF) 100 MCG/2ML IJ SOLN
INTRAMUSCULAR | Status: DC | PRN
  Administered 2016-08-11: 100 ug via INTRAVENOUS
  Administered 2016-08-11 (×2): 50 ug via INTRAVENOUS

## 2016-08-11 MED ORDER — PROPOFOL 10 MG/ML IV BOLUS
INTRAVENOUS | Status: AC
  Filled 2016-08-11: qty 40

## 2016-08-11 MED ORDER — LIDOCAINE HCL (CARDIAC) 20 MG/ML IV SOLN
INTRAVENOUS | Status: DC | PRN
Start: 2016-08-11 — End: 2016-08-11
  Administered 2016-08-11: 50 mg via INTRAVENOUS

## 2016-08-11 MED ORDER — OXYCODONE-ACETAMINOPHEN 5-325 MG PO TABS: 1.0000 | ORAL_TABLET | ORAL | 0 refills | Status: DC | PRN

## 2016-08-11 MED ORDER — SUGAMMADEX SODIUM 500 MG/5ML IV SOLN
INTRAVENOUS | Status: DC | PRN
  Administered 2016-08-11: 500 mg via INTRAVENOUS

## 2016-08-11 MED ORDER — RISAQUAD PO CAPS
1.0000 | ORAL_CAPSULE | Freq: Every day | ORAL | Status: DC
  Filled 2016-08-11: qty 1

## 2016-08-11 MED ORDER — OXYCODONE-ACETAMINOPHEN 5-325 MG PO TABS: 1.0000 | ORAL_TABLET | ORAL | Status: DC | PRN

## 2016-08-11 MED ORDER — MIDAZOLAM HCL 5 MG/5ML IJ SOLN
INTRAMUSCULAR | Status: DC | PRN
Start: 2016-08-11 — End: 2016-08-11
  Administered 2016-08-11: 2 mg via INTRAVENOUS

## 2016-08-11 MED ORDER — METHOCARBAMOL 500 MG PO TABS: 500.0000 mg | ORAL_TABLET | Freq: Four times a day (QID) | ORAL | 1 refills | Status: DC | PRN

## 2016-08-11 MED ORDER — PROPOFOL 10 MG/ML IV BOLUS
INTRAVENOUS | Status: DC | PRN
  Administered 2016-08-11: 200 mg via INTRAVENOUS

## 2016-08-11 MED ORDER — ALUM & MAG HYDROXIDE-SIMETH 200-200-20 MG/5ML PO SUSP: 30.0000 mL | Freq: Four times a day (QID) | ORAL | Status: DC | PRN

## 2016-08-11 MED ORDER — THROMBIN 5000 UNITS EX SOLR
CUTANEOUS | Status: DC | PRN
  Administered 2016-08-11: 10000 [IU] via TOPICAL

## 2016-08-11 MED ORDER — LOSARTAN POTASSIUM-HCTZ 100-25 MG PO TABS: 1.0000 | ORAL_TABLET | Freq: Every day | ORAL | Status: DC

## 2016-08-11 MED ORDER — PROMETHAZINE HCL 25 MG/ML IJ SOLN: 6.2500 mg | INTRAMUSCULAR | Status: DC | PRN

## 2016-08-11 MED ORDER — CEFAZOLIN SODIUM-DEXTROSE 2-4 GM/100ML-% IV SOLN
2.0000 g | Freq: Three times a day (TID) | INTRAVENOUS | Status: DC
  Administered 2016-08-12 (×2): 2 g via INTRAVENOUS
  Filled 2016-08-11 (×2): qty 100

## 2016-08-11 MED ORDER — HYDROMORPHONE HCL 1 MG/ML IJ SOLN: 0.5000 mg | INTRAMUSCULAR | Status: DC | PRN

## 2016-08-11 MED ORDER — PHENTERMINE HCL 37.5 MG PO CAPS: 0.5000 mg | ORAL_CAPSULE | ORAL | Status: DC

## 2016-08-11 MED ORDER — ROCURONIUM BROMIDE 50 MG/5ML IV SOSY
PREFILLED_SYRINGE | INTRAVENOUS | Status: AC
  Filled 2016-08-11: qty 5

## 2016-08-11 MED ORDER — LEVOTHYROXINE SODIUM 25 MCG PO TABS
125.0000 ug | ORAL_TABLET | Freq: Every day | ORAL | Status: DC
  Filled 2016-08-11: qty 1

## 2016-08-11 MED ORDER — EPHEDRINE 5 MG/ML INJ
INTRAVENOUS | Status: AC
  Filled 2016-08-11: qty 10

## 2016-08-11 MED ORDER — TIOPRONIN 100 MG PO TABS: 200.0000 mg | ORAL_TABLET | Freq: Three times a day (TID) | ORAL | Status: DC

## 2016-08-11 MED ORDER — DEXAMETHASONE SODIUM PHOSPHATE 10 MG/ML IJ SOLN
INTRAMUSCULAR | Status: AC
  Filled 2016-08-11: qty 1

## 2016-08-11 MED ORDER — LACTATED RINGERS IV SOLN
INTRAVENOUS | Status: DC
  Administered 2016-08-11 (×2): via INTRAVENOUS

## 2016-08-11 MED ORDER — DOCUSATE SODIUM 100 MG PO CAPS
100.0000 mg | ORAL_CAPSULE | Freq: Two times a day (BID) | ORAL | Status: DC
  Filled 2016-08-11: qty 1

## 2016-08-11 MED ORDER — THROMBIN 5000 UNITS EX SOLR
CUTANEOUS | Status: AC
Start: 2016-08-11 — End: 2016-08-11
  Filled 2016-08-11: qty 10000

## 2016-08-11 MED ORDER — CEFAZOLIN SODIUM-DEXTROSE 2-4 GM/100ML-% IV SOLN: 2.0000 g | INTRAVENOUS | Status: DC

## 2016-08-11 MED ORDER — MAGNESIUM CITRATE PO SOLN: 1.0000 | Freq: Once | ORAL | Status: DC | PRN

## 2016-08-11 MED ORDER — FENTANYL CITRATE (PF) 100 MCG/2ML IJ SOLN
INTRAMUSCULAR | Status: AC
  Filled 2016-08-11: qty 2

## 2016-08-11 MED ORDER — LIDOCAINE-EPINEPHRINE (PF) 1 %-1:200000 IJ SOLN
INTRAMUSCULAR | Status: DC | PRN
  Administered 2016-08-11: 13 mL

## 2016-08-11 MED ORDER — GABAPENTIN 300 MG PO CAPS: 300.0000 mg | ORAL_CAPSULE | Freq: Three times a day (TID) | ORAL | Status: DC | PRN

## 2016-08-11 MED ORDER — MIDAZOLAM HCL 2 MG/2ML IJ SOLN
INTRAMUSCULAR | Status: AC
  Filled 2016-08-11: qty 2

## 2016-08-11 MED ORDER — ACETAMINOPHEN 325 MG PO TABS: 650.0000 mg | ORAL_TABLET | ORAL | Status: DC | PRN

## 2016-08-11 MED ORDER — EPHEDRINE SULFATE 50 MG/ML IJ SOLN
INTRAMUSCULAR | Status: DC | PRN
  Administered 2016-08-11: 10 mg via INTRAVENOUS
  Administered 2016-08-11: 5 mg via INTRAVENOUS
  Administered 2016-08-11: 10 mg via INTRAVENOUS
  Administered 2016-08-11 (×3): 5 mg via INTRAVENOUS

## 2016-08-11 MED ORDER — POLYETHYLENE GLYCOL 3350 17 G PO PACK: 17.0000 g | PACK | Freq: Every day | ORAL | 0 refills | Status: DC

## 2016-08-11 MED ORDER — SODIUM CHLORIDE 0.9 % IR SOLN
Status: AC
  Filled 2016-08-11: qty 500000

## 2016-08-11 MED ORDER — PHENYLEPHRINE HCL 10 MG/ML IJ SOLN
INTRAMUSCULAR | Status: DC | PRN
  Administered 2016-08-11 (×4): 80 ug via INTRAVENOUS

## 2016-08-11 MED ORDER — ALLOPURINOL 100 MG PO TABS
100.0000 mg | ORAL_TABLET | Freq: Every day | ORAL | Status: DC
  Filled 2016-08-11: qty 1

## 2016-08-11 MED ORDER — POLYETHYLENE GLYCOL 3350 17 G PO PACK: 17.0000 g | PACK | Freq: Every day | ORAL | Status: DC | PRN

## 2016-08-11 MED ORDER — DOCUSATE SODIUM 100 MG PO CAPS: 100.0000 mg | ORAL_CAPSULE | Freq: Two times a day (BID) | ORAL | 1 refills | Status: DC | PRN

## 2016-08-11 MED ORDER — DEXAMETHASONE SODIUM PHOSPHATE 10 MG/ML IJ SOLN
INTRAMUSCULAR | Status: DC | PRN
  Administered 2016-08-11: 10 mg via INTRAVENOUS

## 2016-08-11 SURGICAL SUPPLY — 44 items
BAG SPEC THK2 15X12 ZIP CLS (MISCELLANEOUS)
BAG ZIPLOCK 12X15 (MISCELLANEOUS) IMPLANT
CLOTH 2% CHLOROHEXIDINE 3PK (PERSONAL CARE ITEMS) ×2 IMPLANT
DRAPE MICROSCOPE LEICA (MISCELLANEOUS) ×2 IMPLANT
DRAPE SHEET LG 3/4 BI-LAMINATE (DRAPES) IMPLANT
DRAPE SURG 17X11 SM STRL (DRAPES) ×2 IMPLANT
DRAPE UTILITY XL STRL (DRAPES) ×2 IMPLANT
DRSG AQUACEL AG ADV 3.5X 4 (GAUZE/BANDAGES/DRESSINGS) ×2 IMPLANT
DRSG AQUACEL AG ADV 3.5X 6 (GAUZE/BANDAGES/DRESSINGS) IMPLANT
DURAPREP 26ML APPLICATOR (WOUND CARE) ×2 IMPLANT
DURASEAL SPINE SEALANT 3ML (MISCELLANEOUS) IMPLANT
ELECT BLADE TIP CTD 4 INCH (ELECTRODE) ×2 IMPLANT
ELECT REM PT RETURN 9FT ADLT (ELECTROSURGICAL) ×2
ELECTRODE REM PT RTRN 9FT ADLT (ELECTROSURGICAL) ×1 IMPLANT
GLOVE BIOGEL PI IND STRL 7.0 (GLOVE) ×1 IMPLANT
GLOVE BIOGEL PI INDICATOR 7.0 (GLOVE) ×1
GLOVE SURG SS PI 7.0 STRL IVOR (GLOVE) ×2 IMPLANT
GLOVE SURG SS PI 7.5 STRL IVOR (GLOVE) ×2 IMPLANT
GLOVE SURG SS PI 8.0 STRL IVOR (GLOVE) ×4 IMPLANT
GOWN STRL REUS W/TWL XL LVL3 (GOWN DISPOSABLE) ×4 IMPLANT
HEMOSTAT SPONGE AVITENE ULTRA (HEMOSTASIS) IMPLANT
IV CATH 14GX2 1/4 (CATHETERS) IMPLANT
KIT BASIN OR (CUSTOM PROCEDURE TRAY) ×2 IMPLANT
KIT POSITIONING SURG ANDREWS (MISCELLANEOUS) ×2 IMPLANT
MANIFOLD NEPTUNE II (INSTRUMENTS) ×2 IMPLANT
NEEDLE SPNL 18GX3.5 QUINCKE PK (NEEDLE) ×4 IMPLANT
PACK LAMINECTOMY ORTHO (CUSTOM PROCEDURE TRAY) ×2 IMPLANT
PATTIES SURGICAL .5 X.5 (GAUZE/BANDAGES/DRESSINGS) IMPLANT
PATTIES SURGICAL .75X.75 (GAUZE/BANDAGES/DRESSINGS) ×2 IMPLANT
RUBBERBAND STERILE (MISCELLANEOUS) ×4 IMPLANT
SPONGE SURGIFOAM ABS GEL 100 (HEMOSTASIS) ×2 IMPLANT
STAPLER VISISTAT (STAPLE) IMPLANT
STRIP CLOSURE SKIN 1/2X4 (GAUZE/BANDAGES/DRESSINGS) IMPLANT
SUT NURALON 4 0 TR CR/8 (SUTURE) IMPLANT
SUT PROLENE 3 0 PS 2 (SUTURE) ×2 IMPLANT
SUT VIC AB 1 CT1 27 (SUTURE)
SUT VIC AB 1 CT1 27XBRD ANTBC (SUTURE) IMPLANT
SUT VIC AB 1-0 CT2 27 (SUTURE) ×2 IMPLANT
SUT VIC AB 2-0 CT1 27 (SUTURE)
SUT VIC AB 2-0 CT1 TAPERPNT 27 (SUTURE) IMPLANT
SUT VIC AB 2-0 CT2 27 (SUTURE) ×4 IMPLANT
SYR 3ML LL SCALE MARK (SYRINGE) IMPLANT
TOWEL OR 17X26 10 PK STRL BLUE (TOWEL DISPOSABLE) ×2 IMPLANT
YANKAUER SUCT BULB TIP NO VENT (SUCTIONS) ×2 IMPLANT

## 2016-08-11 NOTE — H&P (View-Only) (Signed)
Jorge Dyer is an 50 y.o. male.   Chief Complaint: back and leg pain HPI: The patient is a 50 year old male who presents today for follow up of their back. The patient is being followed for their low back symptoms. They are now 7 years, 1 1/2 months out from lumbar decompression. The patient is 4 weeks out from a flare up. Symptoms reported today include: numbness (right foot), leg pain and pain with standing. Current treatment includes: physical therapy, activity modification and pain medications. The following medication has been used for pain control: Oxycodone (at night) and Tylenol (during the day). The patient presents today following MRI.  Jorge Dyer follows up with his MRI. He has a large disc herniation L5-S1 to the right displacing the S1 nerve root. He has another disc herniation at 3-4 to the left. He has no left-sided symptoms. It is all right posterior down to the outer aspect of his foot.    Past Medical History:  Diagnosis Date  . Chronic kidney disease    h/o kidney stones  . 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 Diabetes mellitus    diet control  . Gallstones   . Gout   . Hyperlipidemia   . Thyroid disease     Past Surgical History:  Procedure Laterality Date  . APPENDECTOMY    . BACK SURGERY    . KIDNEY STONE SURGERY    . SPINE SURGERY      Family History  Problem Relation Age of Onset  . Colon cancer Neg Hx   . Colon polyps Neg Hx   . Esophageal cancer Neg Hx   . Rectal cancer Neg Hx   . Stomach cancer Neg Hx    Social History:  reports that he has never smoked. He has never used smokeless tobacco. He reports that he drinks alcohol. He reports that he does not use drugs.  Allergies: No Known Allergies   (Not in a hospital admission)  No results found for this or any previous visit (from the past 48 hour(s)). No results found.  Review of Systems  Constitutional: Negative.   HENT: Negative.   Eyes: Negative.   Respiratory: Negative.   Cardiovascular: Negative.    Gastrointestinal: Negative.   Genitourinary: Negative.   Musculoskeletal: Positive for back pain.  Skin: Negative.   Neurological: Positive for sensory change and focal weakness.  Psychiatric/Behavioral: Negative.     There were no vitals taken for this visit. Physical Exam  Constitutional: He is oriented to person, place, and time. He appears well-developed.  HENT:  Head: Normocephalic.  Eyes: Pupils are equal, round, and reactive to light.  Neck: Normal range of motion.  Cardiovascular: Normal rate.   Respiratory: Effort normal.  GI: Soft.  Musculoskeletal:   On exam, he is in moderate distress, walks with an antalgic gait. Straight leg raises buttock, thigh and calf pain on the right, negative on left. EHL is 4+/5 and diminished plantar flexion, altered sensation in S1 dermatome, decreased Achilles reflex on the right. Left, he has good quad strength. Negative straight leg raise. Negative femoral stretch. No instability in hips, knees and ankles.  Neurological: He is alert and oriented to person, place, and time.    MRI, paracentral disc herniation and large L5-S1 to the right displacing the S1 nerve root. There is a paracentral disc protrusion at 3-4. He has a previous decompression at 3-4 and 4-5.  Assessment/Plan 1. 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 2. Asymptomatic disc herniation at 3-4. 3. History of lumbar decompression 3-4, 4-5.  We discussed options. Continue physical therapy, epidural. He does not want to proceed with epidural. He would like to proceed with lumbar decompression. It has been over 10 weeks of symptoms of severe pain limiting his activity, given the presence of neurologic deficit and the large neurocompressive lesion it is reasonable to proceed with lumbar decompression. I had an extensive discussion of the  risks and benefits of the lumbar decompression with the patient including bleeding, infection, damage to neurovascular structures, epidural fibrosis, CSF leak requiring repair. We also discussed increase in pain, adjacent segment disease, recurrent disc herniation, need for future surgery including repeat decompression and/or fusion. We also discussed risks of postoperative hematoma, paralysis, anesthetic complications including DVT, PE, death, cardiopulmonary dysfunction. In addition, the perioperative and postoperative courses were discussed in detail including the rehabilitative time and return to functional activity and work. I provided the patient with an illustrated handout and utilized the appropriate surgical models. I will see him. We try to get that scheduled as soon as possible. He would like to have that before the end of the year. Analgesics in the interim.  I had an extensive discussion of the risks and benefits of the lumbar decompression with the patient including bleeding, infection, damage to neurovascular structures, epidural fibrosis, CSF leak requiring repair. We also discussed increase in pain, adjacent segment disease, recurrent disc herniation, need for future surgery including repeat decompression and/or fusion. We also discussed risks of postoperative hematoma, paralysis, anesthetic complications including DVT, PE, death, cardiopulmonary dysfunction. In addition, the perioperative and postoperative courses were discussed in detail including the rehabilitative time and return to functional activity and work. I provided the patient with an illustrated handout and utilized the appropriate surgical models.  Cecilie Kicks., PA-C for Dr. Tonita Cong 08/03/2016, 4:38 PM

## 2016-08-11 NOTE — Anesthesia Procedure Notes (Signed)
Procedure Name: Intubation Date/Time: 08/11/2016 3:08 PM Performed by: Glora Hulgan, Virgel Gess Pre-anesthesia Checklist: Patient identified, Emergency Drugs available, Suction available, Patient being monitored and Timeout performed Patient Re-evaluated:Patient Re-evaluated prior to inductionOxygen Delivery Method: Circle system utilized Preoxygenation: Pre-oxygenation with 100% oxygen Intubation Type: IV induction Ventilation: Mask ventilation without difficulty and Oral airway inserted - appropriate to patient size Laryngoscope Size: Mac and 4 Grade View: Grade II Tube type: Oral Tube size: 7.5 mm Number of attempts: 1 Airway Equipment and Method: Stylet Placement Confirmation: ETT inserted through vocal cords under direct vision,  positive ETCO2,  CO2 detector and breath sounds checked- equal and bilateral Secured at: 23 cm Tube secured with: Tape Dental Injury: Teeth and Oropharynx as per pre-operative assessment

## 2016-08-11 NOTE — Transfer of Care (Signed)
Immediate Anesthesia Transfer of Care Note  Patient: Jorge Dyer  Procedure(s) Performed: Procedure(s) with comments: MICRO LUMBAR DECOMPRESSION L5-S1 ON THE RIGHT  LEVEL 1 (Right) - Requesting 2 hours  Patient Location: PACU  Anesthesia Type:General  Level of Consciousness:  sedated, patient cooperative and responds to stimulation  Airway & Oxygen Therapy:Patient Spontanous Breathing and Patient connected to face mask oxgen  Post-op Assessment:  Report given to PACU RN and Post -op Vital signs reviewed and stable  Post vital signs:  Reviewed and stable  Last Vitals:  Vitals:   08/11/16 1310  BP: 127/78  Pulse: 88  Resp: 18  Temp: [id number] C    Complications: No apparent anesthesia complications

## 2016-08-11 NOTE — Anesthesia Postprocedure Evaluation (Signed)
Anesthesia Post Note  Patient: Jorge Dyer  Procedure(s) Performed: Procedure(s) (LRB): MICRO LUMBAR DECOMPRESSION L5-S1 ON THE RIGHT  LEVEL 1 (Right)  Patient location during evaluation: PACU Anesthesia Type: General Level of consciousness: sedated and patient cooperative Pain management: pain level controlled Vital Signs Assessment: post-procedure vital signs reviewed and stable Respiratory status: spontaneous breathing Cardiovascular status: stable Anesthetic complications: no       Last Vitals:  Vitals:   08/11/16 1745 08/11/16 1755  BP: 109/70 129/64  Pulse: 78 83  Resp: 14 16  Temp: 36.9 C 36.8 C    Last Pain:  Vitals:   08/11/16 1717  TempSrc:   PainSc: 0-No pain                 Nolon Nations

## 2016-08-11 NOTE — Brief Op Note (Signed)
08/11/2016  4:57 PM  PATIENT:  Jorge Dyer  50 y.o. male  PRE-OPERATIVE DIAGNOSIS:  HNP L5-S1 Right  POST-OPERATIVE DIAGNOSIS:  HNP L5-S1 Right  PROCEDURE:  Procedure(s) with comments: MICRO LUMBAR DECOMPRESSION L5-S1 ON THE RIGHT  LEVEL 1 (Right) - Requesting 2 hours  SURGEON:  Surgeon(s) and Role:    * Susa Day, MD - Primary  PHYSICIAN ASSISTANT:   ASSISTANTS: [place]   ANESTHESIA:   general  EBL:  Total I/O In: 1000 [I.V.:1000] Out: 50 [Blood:50]  BLOOD ADMINISTERED:none  DRAINS: none   LOCAL MEDICATIONS USED:  MARCAINE     SPECIMEN:  Source of Specimen:  L5S1  DISPOSITION OF SPECIMEN:  PATHOLOGY  COUNTS:  YES  TOURNIQUET:  * No tourniquets in log *  DICTATION: .Other Dictation: Dictation Number [id number]  PLAN OF CARE: Admit for overnight observation  PATIENT DISPOSITION:  PACU - hemodynamically stable.   Delay start of Pharmacological VTE agent (>24hrs) due to surgical blood loss or risk of bleeding: yes

## 2016-08-11 NOTE — Interval H&P Note (Signed)
History and Physical Interval Note:  08/11/2016 2:53 PM  Jorge Dyer  has presented today for surgery, with the diagnosis of HNP L5-S1 Right  The various methods of treatment have been discussed with the patient and family. After consideration of risks, benefits and other options for treatment, the patient has consented to  Procedure(s) with comments: MICRO LUMBAR DECOMPRESSION L5-S1 ON THE RIGHT  LEVEL 1 (Right) - Requesting 2 hours as a surgical intervention .  The patient's history has been reviewed, patient examined, no change in status, stable for surgery.  I have reviewed the patient's chart and labs.  Questions were answered to the patient's satisfaction.     Nathanael Krist C

## 2016-08-11 NOTE — Anesthesia Preprocedure Evaluation (Signed)
Anesthesia Evaluation  Patient identified by MRN, date of birth, ID band Patient awake    Reviewed: Allergy & Precautions, NPO status , Patient's Chart, lab work & pertinent test results  Airway Mallampati: II  TM Distance: >3 FB Neck ROM: Full    Dental no notable dental hx.    Pulmonary neg pulmonary ROS,    Pulmonary exam normal breath sounds clear to auscultation       Cardiovascular hypertension, Pt. on medications Normal cardiovascular exam Rhythm:Regular Rate:Normal     Neuro/Psych negative neurological ROS  negative psych ROS   GI/Hepatic negative GI ROS, Neg liver ROS,   Endo/Other  diabetes, Type 2Hypothyroidism   Renal/GU Renal InsufficiencyRenal disease     Musculoskeletal  (+) Arthritis ,   Abdominal (+) + obese,   Peds  Hematology negative hematology ROS (+)   Anesthesia Other Findings   Reproductive/Obstetrics negative OB ROS                             Anesthesia Physical Anesthesia Plan  ASA: III  Anesthesia Plan: General   Post-op Pain Management:    Induction: Intravenous  Airway Management Planned: Oral ETT  Additional Equipment:   Intra-op Plan:   Post-operative Plan: Extubation in OR  Informed Consent: I have reviewed the patients History and Physical, chart, labs and discussed the procedure including the risks, benefits and alternatives for the proposed anesthesia with the patient or authorized representative who has indicated his/her understanding and acceptance.   Dental advisory given  Plan Discussed with: CRNA  Anesthesia Plan Comments:         Anesthesia Quick Evaluation

## 2016-08-11 NOTE — Discharge Instructions (Signed)

## 2016-08-12 DIAGNOSIS — Z79899 Other long term (current) drug therapy: Secondary | ICD-10-CM | POA: Diagnosis not present

## 2016-08-12 DIAGNOSIS — M48061 Spinal stenosis, lumbar region without neurogenic claudication: Secondary | ICD-10-CM | POA: Diagnosis not present

## 2016-08-12 DIAGNOSIS — Z794 Long term (current) use of insulin: Secondary | ICD-10-CM | POA: Diagnosis not present

## 2016-08-12 DIAGNOSIS — M109 Gout, unspecified: Secondary | ICD-10-CM | POA: Diagnosis not present

## 2016-08-12 DIAGNOSIS — M4726 Other spondylosis with radiculopathy, lumbar region: Secondary | ICD-10-CM | POA: Diagnosis not present

## 2016-08-12 DIAGNOSIS — Z79891 Long term (current) use of opiate analgesic: Secondary | ICD-10-CM | POA: Diagnosis not present

## 2016-08-12 DIAGNOSIS — Z9889 Other specified postprocedural states: Secondary | ICD-10-CM | POA: Diagnosis not present

## 2016-08-12 DIAGNOSIS — Z87442 Personal history of urinary calculi: Secondary | ICD-10-CM | POA: Diagnosis not present

## 2016-08-12 DIAGNOSIS — E7201 Cystinuria: Secondary | ICD-10-CM | POA: Diagnosis not present

## 2016-08-12 DIAGNOSIS — I10 Essential (primary) hypertension: Secondary | ICD-10-CM | POA: Diagnosis not present

## 2016-08-12 DIAGNOSIS — E039 Hypothyroidism, unspecified: Secondary | ICD-10-CM | POA: Diagnosis not present

## 2016-08-12 DIAGNOSIS — E119 Type 2 diabetes mellitus without complications: Secondary | ICD-10-CM | POA: Diagnosis not present

## 2016-08-12 DIAGNOSIS — Z9049 Acquired absence of other specified parts of digestive tract: Secondary | ICD-10-CM | POA: Diagnosis not present

## 2016-08-12 DIAGNOSIS — M5116 Intervertebral disc disorders with radiculopathy, lumbar region: Secondary | ICD-10-CM | POA: Diagnosis not present

## 2016-08-12 LAB — GLUCOSE, CAPILLARY: Glucose-Capillary: 146 mg/dL — ABNORMAL HIGH (ref 65–99)

## 2016-08-12 NOTE — Progress Notes (Signed)
   Subjective: 1 Day Post-Op Procedure(s) (LRB): MICRO LUMBAR DECOMPRESSION L5-S1 ON THE RIGHT  LEVEL 1 (Right)  Pt doing very well Already walked twice since surgery with no issues Ready to d/c after therapy today Patient reports pain as mild.  Objective:   VITALS:   Vitals:   08/11/16 2015 08/12/16 0626  BP: 122/65 (!) 141/63  Pulse: 65 63  Resp: 18 18  Temp: 98 F (36.7 C) 97.5 F (36.4 C)    Lumbar dressing in place nv intact distally No rashes or edema  LABS No results for input(s): HGB, HCT, WBC, PLT in the last 72 hours.  No results for input(s): NA, K, BUN, CREATININE, GLUCOSE in the last 72 hours.   Assessment/Plan: 1 Day Post-Op Procedure(s) (LRB): MICRO LUMBAR DECOMPRESSION L5-S1 ON THE RIGHT  LEVEL 1 (Right) D/c home today Pt doing very well F/u in 10 days in the office    Brad Shanena Pellegrino, [place], PA-C  08/12/2016, 8:00 AM

## 2016-08-12 NOTE — Progress Notes (Signed)
Went over all discharge paperwork with patient and family.  All questions answered.  Pt requested to walk out with wife.  Paperwork and prescriptions given to patient.

## 2016-08-12 NOTE — Discharge Summary (Signed)
Physician Discharge Summary   Patient ID: Jorge Dyer MRN: [id number] DOB/AGE: 03/19/1966 50 y.o.  Admit date: 08/11/2016 Discharge date: 08/12/2016  Admission Diagnoses:  Principal Problem:   HNP (herniated nucleus pulposus), lumbar Active Problems:   Spinal stenosis of lumbar region   Discharge Diagnoses:  Same   Surgeries: Procedure(s): MICRO LUMBAR DECOMPRESSION L5-S1 ON THE RIGHT  LEVEL 1 on 08/11/2016   Consultants: PT  Discharged Condition: Stable  Hospital Course: Jorge Dyer is an 50 y.o. male who was admitted 08/11/2016 with a chief complaint of lumbar radiculopathy, and found to have a diagnosis of HNP (herniated nucleus pulposus), lumbar.  They were brought to the operating room on 08/11/2016 and underwent the above named procedures.    The patient had an uncomplicated hospital course and was stable for discharge.  Recent vital signs:  Vitals:   08/11/16 2015 08/12/16 0626  BP: 122/65 (!) 141/63  Pulse: 65 63  Resp: 18 18  Temp: 98 F (36.7 C) 97.5 F (36.4 C)    Recent laboratory studies:  Results for orders placed or performed during the hospital encounter of 08/11/16  Glucose, capillary  Result Value Ref Range   Glucose-Capillary 117 (H) 65 - 99 mg/dL   Comment 1 Notify RN    Comment 2 Document in Chart   Glucose, capillary  Result Value Ref Range   Glucose-Capillary 146 (H) 65 - 99 mg/dL   Comment 1 Notify RN    Comment 2 Document in Chart   Glucose, capillary  Result Value Ref Range   Glucose-Capillary 229 (H) 65 - 99 mg/dL    Discharge Medications:   Allergies as of 08/12/2016   No Known Allergies     Medication List    STOP taking these medications   phentermine 37.5 MG capsule   simvastatin 40 MG tablet Commonly known as:  ZOCOR     TAKE these medications   acetaminophen 650 MG CR tablet Commonly known as:  TYLENOL Take 650 mg by mouth every morning.   allopurinol 100 MG tablet Commonly known as:  ZYLOPRIM Take 100  mg by mouth daily.   docusate sodium 100 MG capsule Commonly known as:  COLACE Take 1 capsule (100 mg total) by mouth 2 (two) times daily as needed for mild constipation.   gabapentin 300 MG capsule Commonly known as:  NEURONTIN Take 300 mg by mouth 3 (three) times daily as needed.   levothyroxine 125 MCG tablet Commonly known as:  SYNTHROID, LEVOTHROID Take 125 mcg by mouth daily before breakfast.   losartan-hydrochlorothiazide 100-25 MG tablet Commonly known as:  HYZAAR Take 1 tablet by mouth daily.   methocarbamol 500 MG tablet Commonly known as:  ROBAXIN Take 1 tablet (500 mg total) by mouth every 6 (six) hours as needed for muscle spasms.   oxyCODONE-acetaminophen 5-325 MG tablet Commonly known as:  PERCOCET Take 1-2 tablets by mouth every 4 (four) hours as needed for severe pain.   polyethylene glycol packet Commonly known as:  MIRALAX / GLYCOLAX Take 17 g by mouth daily.   THIOLA 100 MG tablet Generic drug:  tiopronin Take 200 mg by mouth 2 (two) times daily. Pt states he takes six 100 mg daily   topiramate 50 MG tablet Commonly known as:  TOPAMAX Take 50 mg by mouth daily.   TRULICITY 1.5 0000000 Sopn Generic drug:  Dulaglutide Inject 1.5 mg into the skin once a week. Thursdays       Diagnostic Studies: Dg Lumbar Spine 2-3  Views  Result Date: 08/08/2016 CLINICAL DATA:  Preop evaluation for upcoming lumbar surgery EXAM: LUMBAR SPINE - 3 VIEW COMPARISON:  None. FINDINGS: Five lumbar type vertebral bodies are well visualized. Vertebral body height is well maintained. Mild osteophytic changes are seen. No anterolisthesis is noted. Mild aortic calcifications are seen. IMPRESSION: Mild degenerative change as described. Electronically Signed   By: Inez Catalina M.D.   On: 08/08/2016 11:37   Dg Spine Portable 1 View  Result Date: 08/11/2016 CLINICAL DATA:  Lumbar disc disease. EXAM: PORTABLE SPINE - 1 VIEW COMPARISON:  Radiographs dated 08/08/2016 FINDINGS: There  is an instrument at the L5-S1 disk space level. IMPRESSION: There is an instrument at the L5-S1 level. Electronically Signed   By: Lorriane Shire M.D.   On: 08/11/2016 16:36   Dg Spine Portable 1 View  Result Date: 08/11/2016 CLINICAL DATA:  50 year old male undergoing lumbar surgery. Initial encounter. EXAM: PORTABLE SPINE - 1 VIEW COMPARISON:  Intraoperative image 15 14 hours today, preoperative lumbar radiographs 08/08/2016, and CT lumbar myelogram 06/02/2009. FINDINGS: Intraoperative portable cross-table lateral view of the lumbar spine labeled image #2 at 1520 hours. Normal lumbar segmentation as demonstrated on the 12/26 and earlier studies. There is now a single surgical probe directed at the S1 vertebral body level. IMPRESSION: Intraoperative localization at S1. Electronically Signed   By: Genevie Ann M.D.   On: 08/11/2016 15:48   Dg Spine Portable 1 View  Result Date: 08/11/2016 CLINICAL DATA:  Number lumbar vertebral bodies. Patient for L5-S1 lumbar decompression. EXAM: PORTABLE SPINE - 1 VIEW COMPARISON:  Lumbar spine radiograph 08/08/2016 FINDINGS: Lateral spine radiograph demonstrates multilevel lower lumbar spine degenerative changes. Surgical hardware is located within the soft tissues overlying the posterior elements of the L5 and S1 vertebral bodies. IMPRESSION: Spine labeling.  Lumbar spine degenerative change. Electronically Signed   By: Lovey Newcomer M.D.   On: 08/11/2016 15:38    Disposition:   Discharge Instructions    Call MD / Call 911    Complete by:  As directed    If you experience chest pain or shortness of breath, CALL 911 and be transported to the hospital emergency room.  If you develope a fever above 101 F, pus (white drainage) or increased drainage or redness at the wound, or calf pain, call your surgeon's office.   Constipation Prevention    Complete by:  As directed    Drink plenty of fluids.  Prune juice may be helpful.  You may use a stool softener, such as Colace  (over the counter) 100 mg twice a day.  Use MiraLax (over the counter) for constipation as needed.   Diet - low sodium heart healthy    Complete by:  As directed    Increase activity slowly as tolerated    Complete by:  As directed       Follow-up Information    BEANE,JEFFREY C, MD Follow up in 2 week(s).   Specialty:  Orthopedic Surgery Contact information: [address] [place] [address] [phone]        Johnn Hai, MD In 2 weeks.   Specialty:  Orthopedic Surgery Contact information: [address] [place] [address] [phone]            Signed: Ventura Bruns 08/12/2016, 8:02 AM

## 2016-08-15 ENCOUNTER — Encounter (HOSPITAL_COMMUNITY): Payer: Self-pay | Admitting: Specialist

## 2016-08-15 NOTE — Op Note (Signed)
NAME:  Jorge Dyer, Jorge Dyer NO.:  MEDICAL RECORD NO.:  [id number]  LOCATION:                                 FACILITY:  PHYSICIAN:  Susa Day, M.D.         DATE OF BIRTH:  DATE OF PROCEDURE:  08/11/2016 DATE OF DISCHARGE:                              OPERATIVE REPORT   PREOPERATIVE DIAGNOSES: 1. Spinal stenosis, herniated nucleus pulposus, L5-S1, right. 2. Elevated BMI of 31.  POSTOPERATIVE DIAGNOSES: 1. Spinal stenosis, herniated nucleus pulposus, L5-S1, right. 2. Elevated BMI of 31.  PROCEDURES PERFORMED: 1. Microlumbar decompression, L5-S1, right. 2. Foraminotomy, L5-S1, right. 3. Microdiskectomy, L5-S1, right.  ANESTHESIA:  General.  ASSISTANT:  Cleophas Dunker, PA.  Technical difficulty increased due to the patient's elevated BMI.  HISTORY:  A 50, right lower extremity radicular pain secondary to disk herniation, spinal stenosis, L5-S1, foraminal stenosis.  History of lumbar decompression at L3-4 and L4-5.  Indicated for microlumbar decompression failing conservative treatment.  Risks and benefits were discussed including bleeding, infection, damage to the neurovascular structures, no change in symptoms, worsening symptoms, DVT, PE, anesthetic complications, etc.  TECHNIQUE:  With the patient in supine position, after induction of adequate general anesthesia, 2 g of Kefzol, placed prone on the Cromwell frame.  All bony prominences were well padded.  Lumbar region was prepped and draped in usual sterile fashion.  Two 18-gauge spinal needles were utilized to localize the L5-S1 interspace, confirmed with x- ray.  Incision was made from the spinous process of L5-S1.  Subcutaneous tissue was dissected, electrocautery was utilized to achieve hemostasis. Dorsolumbar fascia was divided in line of the skin incision. Paraspinous muscle was elevated from lamina of L5-S1.  McCullough retractor was placed, operating microscope was draped and brought on  the surgical field.  Confirmatory radiograph obtained.  Straight curette utilized to detach the ligamentum flavum from the cephalad edge of S1. Hemilaminotomy of the caudad edge of L5 was performed as well.  Neuro patty placed beneath the ligamentum flavum.  I performed a generous foraminotomy of S1.  The S1 nerve root was compressing the lateral recess, was gently mobilized medially.  We decompressed the lateral recess, the medial border of the pedicle.  Large HNP was noted at the disk space and migrating cephalad.  There was neural foraminal stenosis of L5 as well.  After gently mobilizing the S1 nerve root medially and performing an annulotomy, copious portion of the disk material was removed from the disk space with straight and upbiting pituitary, further mobilized with an Epstein.  He had endplate osteophyte as well, this was removed with a 2-mm Kerrison.  Further mobilized with an Epstein.  Then from the opposite side of the operating room table, I carefully performed a foraminotomy of L5 undercutting the facet out into the foramen of L5, protecting the L5 root at all times.  There was a disk osteophyte complex at the disk space.  This was mobilized with an Epstein.  Retrieved fragments out into the foramen.  Irrigated the disk space with catheter lavage, retrieved couple of additional fragments. No probe passed freely at the foramen of L5 and  at S1, and cephalad past pedicle of L5 without neural tension.  Again, the osteophytes over the endplates of L5 were noted.  A 1 cm of excursion of the S1 nerve root medial to pedicle without tension was noted.  No evidence of CSF leakage or active bleeding.  No residual disk herniation beneath thecal sac, the axilla of the shoulder, the root at the foramen L5 or S1.  Copiously irrigated the disk space, no evidence of CSF leakage or active bleeding. We removed the [facility] retractor.  We closed the dorsolumbar fascia with 1 Vicryl, subcu  with multiple 2-0, good use of extra-long retractors.  Skin was reapproximated with Prolene.  Sterile dressing applied.  Placed supine on the hospital bed, extubated without difficulty and transported to the recovery room in satisfactory condition.  The patient tolerated the procedure well.  No complications.  Assistant, Cleophas Dunker, PA, was used throughout the case for the patient's positioning, general intermittent neural traction and suction.     Susa Day, M.D.     Geralynn Rile  D:  08/11/2016  T:  08/12/2016  Job:  [id number]

## 2016-08-16 ENCOUNTER — Encounter: Payer: BLUE CROSS/BLUE SHIELD | Admitting: Internal Medicine

## 2016-11-15 DIAGNOSIS — M109 Gout, unspecified: Secondary | ICD-10-CM | POA: Diagnosis not present

## 2016-11-15 DIAGNOSIS — E1165 Type 2 diabetes mellitus with hyperglycemia: Secondary | ICD-10-CM | POA: Diagnosis not present

## 2016-11-15 DIAGNOSIS — Z0000 Encounter for general adult medical examination without abnormal findings: Secondary | ICD-10-CM | POA: Diagnosis not present

## 2016-11-15 DIAGNOSIS — Z125 Encounter for screening for malignant neoplasm of prostate: Secondary | ICD-10-CM | POA: Diagnosis not present

## 2016-11-15 DIAGNOSIS — E038 Other specified hypothyroidism: Secondary | ICD-10-CM | POA: Diagnosis not present

## 2016-11-23 DIAGNOSIS — Z23 Encounter for immunization: Secondary | ICD-10-CM | POA: Diagnosis not present

## 2016-11-23 DIAGNOSIS — I10 Essential (primary) hypertension: Secondary | ICD-10-CM | POA: Diagnosis not present

## 2016-11-23 DIAGNOSIS — Z1389 Encounter for screening for other disorder: Secondary | ICD-10-CM | POA: Diagnosis not present

## 2016-11-23 DIAGNOSIS — E784 Other hyperlipidemia: Secondary | ICD-10-CM | POA: Diagnosis not present

## 2016-11-23 DIAGNOSIS — Z0000 Encounter for general adult medical examination without abnormal findings: Secondary | ICD-10-CM | POA: Diagnosis not present

## 2016-11-23 DIAGNOSIS — G6289 Other specified polyneuropathies: Secondary | ICD-10-CM | POA: Diagnosis not present

## 2016-11-23 DIAGNOSIS — E038 Other specified hypothyroidism: Secondary | ICD-10-CM | POA: Diagnosis not present

## 2017-01-01 ENCOUNTER — Encounter: Payer: Self-pay | Admitting: Internal Medicine

## 2017-01-12 DIAGNOSIS — N200 Calculus of kidney: Secondary | ICD-10-CM | POA: Diagnosis not present

## 2017-01-12 DIAGNOSIS — N261 Atrophy of kidney (terminal): Secondary | ICD-10-CM | POA: Diagnosis not present

## 2017-01-12 DIAGNOSIS — N2889 Other specified disorders of kidney and ureter: Secondary | ICD-10-CM | POA: Diagnosis not present

## 2017-01-12 DIAGNOSIS — E7201 Cystinuria: Secondary | ICD-10-CM | POA: Diagnosis not present

## 2017-02-26 ENCOUNTER — Ambulatory Visit (AMBULATORY_SURGERY_CENTER): Payer: Self-pay

## 2017-02-26 VITALS — Ht 72.0 in | Wt 263.0 lb

## 2017-02-26 DIAGNOSIS — Z1211 Encounter for screening for malignant neoplasm of colon: Secondary | ICD-10-CM

## 2017-02-26 MED ORDER — NA SULFATE-K SULFATE-MG SULF 17.5-3.13-1.6 GM/177ML PO SOLN
1.0000 | Freq: Once | ORAL | 0 refills | Status: AC
Start: 2017-02-26 — End: 2017-02-26

## 2017-02-26 NOTE — Progress Notes (Signed)
Denies allergies to eggs or soy products. Denies complication of anesthesia or sedation. Denies use of weight loss medication. Denies use of O2.   Emmi instructions declined.  

## 2017-03-01 ENCOUNTER — Encounter: Payer: Self-pay | Admitting: Internal Medicine

## 2017-03-12 ENCOUNTER — Encounter: Payer: Self-pay | Admitting: Internal Medicine

## 2017-03-12 ENCOUNTER — Ambulatory Visit (AMBULATORY_SURGERY_CENTER): Payer: BLUE CROSS/BLUE SHIELD | Admitting: Internal Medicine

## 2017-03-12 VITALS — BP 102/66 | HR 57 | Temp 97.1°F | Resp 18 | Ht 72.0 in | Wt 263.0 lb

## 2017-03-12 DIAGNOSIS — K6389 Other specified diseases of intestine: Secondary | ICD-10-CM

## 2017-03-12 DIAGNOSIS — Z1212 Encounter for screening for malignant neoplasm of rectum: Secondary | ICD-10-CM | POA: Diagnosis not present

## 2017-03-12 DIAGNOSIS — Z1211 Encounter for screening for malignant neoplasm of colon: Secondary | ICD-10-CM | POA: Diagnosis not present

## 2017-03-12 DIAGNOSIS — D127 Benign neoplasm of rectosigmoid junction: Secondary | ICD-10-CM

## 2017-03-12 MED ORDER — SODIUM CHLORIDE 0.9 % IV SOLN: 500.0000 mL | INTRAVENOUS | Status: AC

## 2017-03-12 NOTE — Op Note (Signed)
[place] Patient Name: Jorge Dyer Procedure Date: 03/12/2017 7:42 AM MRN: [id number] Endoscopist: Jerene Bears , MD Age: 51 Referring MD:  Date of Birth: 17-Aug-1965 Gender: Male Account #: [id number] Procedure:                Colonoscopy Indications:              Screening for colorectal malignant neoplasm, This                            is the patient's first colonoscopy Medicines:                Monitored Anesthesia Care Procedure:                Pre-Anesthesia Assessment:                           - Prior to the procedure, a History and Physical                            was performed, and patient medications and                            allergies were reviewed. The patient's tolerance of                            previous anesthesia was also reviewed. The risks                            and benefits of the procedure and the sedation                            options and risks were discussed with the patient.                            All questions were answered, and informed consent                            was obtained. Prior Anticoagulants: The patient has                            taken no previous anticoagulant or antiplatelet                            agents. ASA Grade Assessment: II - A patient with                            mild systemic disease. After reviewing the risks                            and benefits, the patient was deemed in                            satisfactory condition to undergo the procedure.                           -  Prior to the procedure, a History and Physical                            was performed, and patient medications and                            allergies were reviewed. The patient's tolerance of                            previous anesthesia was also reviewed. The risks                            and benefits of the procedure and the sedation                            options and risks were discussed with the  patient.                            All questions were answered, and informed consent                            was obtained. Prior Anticoagulants: The patient has                            taken no previous anticoagulant or antiplatelet                            agents. ASA Grade Assessment: II - A patient with                            mild systemic disease. After reviewing the risks                            and benefits, the patient was deemed in                            satisfactory condition to undergo the procedure.                           After obtaining informed consent, the colonoscope                            was passed under direct vision. Throughout the                            procedure, the patient's blood pressure, pulse, and                            oxygen saturations were monitored continuously. The                            Colonoscope was introduced through the anus and  advanced to the the cecum, identified by                            appendiceal orifice and ileocecal valve. The                            colonoscopy was performed without difficulty. The                            patient tolerated the procedure well. The quality                            of the bowel preparation was good. The ileocecal                            valve, appendiceal orifice, and rectum were                            photographed. Scope In: 8:14:13 AM Scope Out: 8:36:33 AM Scope Withdrawal Time: 0 hours 14 minutes 29 seconds  Total Procedure Duration: 0 hours 22 minutes 20 seconds  Findings:                 The digital rectal exam was normal.                           One 15 mm submucosal nodule was found in the                            descending colon. Multiple biopsies were obtained                            with cold forceps for histology in a targeted                            manner.                           A 4 mm polyp was  found in the recto-sigmoid colon.                            The polyp was sessile. The polyp was removed with a                            cold biopsy forceps. Resection and retrieval were                            complete.                           Internal hemorrhoids were found during                            retroflexion. The hemorrhoids were small. Complications:            No immediate complications. Estimated  Blood Loss:     Estimated blood loss was minimal. Impression:               - Submucosal nodule in the descending colon.                           - One 4 mm polyp at the recto-sigmoid colon,                            removed with a cold biopsy forceps. Resected and                            retrieved.                           - Internal hemorrhoids.                           - Multiple biopsies were obtained. Recommendation:           - Patient has a contact number available for                            emergencies. The signs and symptoms of potential                            delayed complications were discussed with the                            patient. Return to normal activities tomorrow.                            Written discharge instructions were provided to the                            patient.                           - Resume previous diet.                           - Continue present medications.                           - Await pathology results.                           - Repeat colonoscopy is recommended. The                            colonoscopy date will be determined after pathology                            results from today's exam become available for                            review. Jerene Bears, MD 03/12/2017 8:41:39 AM This report has  been signed electronically.

## 2017-03-12 NOTE — Progress Notes (Signed)
To recovery, report to [place], RN, VSS 

## 2017-03-12 NOTE — Progress Notes (Signed)
Called to room to assist during endoscopic procedure.  Patient ID and intended procedure confirmed with present staff. Received instructions for my participation in the procedure from the performing physician.  

## 2017-03-12 NOTE — Progress Notes (Signed)
Pt's states no medical or surgical changes since previsit or office visit.  No egg or soy allergy  

## 2017-03-12 NOTE — Patient Instructions (Signed)
Impressions/recommendations:  Polyp (handout given) Hemorrhoids (handout given)  YOU HAD AN ENDOSCOPIC PROCEDURE TODAY AT [facility]:   Refer to the procedure report that was given to you for any specific questions about what was found during the examination.  If the procedure report does not answer your questions, please call your gastroenterologist to clarify.  If you requested that your care partner not be given the details of your procedure findings, then the procedure report has been included in a sealed envelope for you to review at your convenience later.  YOU SHOULD EXPECT: Some feelings of bloating in the abdomen. Passage of more gas than usual.  Walking can help get rid of the air that was put into your GI tract during the procedure and reduce the bloating. If you had a lower endoscopy (such as a colonoscopy or flexible sigmoidoscopy) you may notice spotting of blood in your stool or on the toilet paper. If you underwent a bowel prep for your procedure, you may not have a normal bowel movement for a few days.  Please Note:  You might notice some irritation and congestion in your nose or some drainage.  This is from the oxygen used during your procedure.  There is no need for concern and it should clear up in a day or so.  SYMPTOMS TO REPORT IMMEDIATELY:   Following lower endoscopy (colonoscopy or flexible sigmoidoscopy):  Excessive amounts of blood in the stool  Significant tenderness or worsening of abdominal pains  Swelling of the abdomen that is new, acute  Fever of 100F or higher   For urgent or emergent issues, a gastroenterologist can be reached at any hour by calling [phone].   DIET:  We do recommend a small meal at first, but then you may proceed to your regular diet.  Drink plenty of fluids but you should avoid alcoholic beverages for 24 hours.  ACTIVITY:  You should plan to take it easy for the rest of today and you should NOT DRIVE or use heavy  machinery until tomorrow (because of the sedation medicines used during the test).    FOLLOW UP: Our staff will call the number listed on your records the next business day following your procedure to check on you and address any questions or concerns that you may have regarding the information given to you following your procedure. If we do not reach you, we will leave a message.  However, if you are feeling well and you are not experiencing any problems, there is no need to return our call.  We will assume that you have returned to your regular daily activities without incident.  If any biopsies were taken you will be contacted by phone or by letter within the next 1-3 weeks.  Please call us at [phone] if you have not heard about the biopsies in 3 weeks.    SIGNATURES/CONFIDENTIALITY: You and/or your care partner have signed paperwork which will be entered into your electronic medical record.  These signatures attest to the fact that that the information above on your After Visit Summary has been reviewed and is understood.  Full responsibility of the confidentiality of this discharge information lies with you and/or your care-partner.

## 2017-03-13 ENCOUNTER — Telehealth: Payer: Self-pay | Admitting: *Deleted

## 2017-03-13 NOTE — Telephone Encounter (Signed)
  Follow up Call-  Call back number 03/12/2017  Post procedure Call Back phone  # [phone]  Permission to leave phone message Yes  Some recent data might be hidden     Patient questions:  Do you have a fever, pain , or abdominal swelling? No. Pain Score  0 *  Have you tolerated food without any problems? Yes.    Have you been able to return to your normal activities? Yes.    Do you have any questions about your discharge instructions: Diet   No. Medications  No. Follow up visit  No.  Do you have questions or concerns about your Care? No.  Actions: * If pain score is 4 or above: No action needed, pain <4.

## 2017-03-15 ENCOUNTER — Encounter: Payer: Self-pay | Admitting: Internal Medicine

## 2017-04-23 DIAGNOSIS — H5213 Myopia, bilateral: Secondary | ICD-10-CM | POA: Diagnosis not present

## 2017-04-23 DIAGNOSIS — E113292 Type 2 diabetes mellitus with mild nonproliferative diabetic retinopathy without macular edema, left eye: Secondary | ICD-10-CM | POA: Diagnosis not present

## 2017-05-24 DIAGNOSIS — E1149 Type 2 diabetes mellitus with other diabetic neurological complication: Secondary | ICD-10-CM | POA: Diagnosis not present

## 2017-06-27 DIAGNOSIS — I10 Essential (primary) hypertension: Secondary | ICD-10-CM | POA: Diagnosis not present

## 2017-06-27 DIAGNOSIS — E7849 Other hyperlipidemia: Secondary | ICD-10-CM | POA: Diagnosis not present

## 2017-06-27 DIAGNOSIS — E11319 Type 2 diabetes mellitus with unspecified diabetic retinopathy without macular edema: Secondary | ICD-10-CM | POA: Diagnosis not present

## 2017-06-27 DIAGNOSIS — Z23 Encounter for immunization: Secondary | ICD-10-CM | POA: Diagnosis not present

## 2017-06-27 DIAGNOSIS — E1139 Type 2 diabetes mellitus with other diabetic ophthalmic complication: Secondary | ICD-10-CM | POA: Diagnosis not present

## 2017-12-27 DIAGNOSIS — Z0000 Encounter for general adult medical examination without abnormal findings: Secondary | ICD-10-CM | POA: Diagnosis not present

## 2017-12-27 DIAGNOSIS — R82998 Other abnormal findings in urine: Secondary | ICD-10-CM | POA: Diagnosis not present

## 2017-12-27 DIAGNOSIS — M109 Gout, unspecified: Secondary | ICD-10-CM | POA: Diagnosis not present

## 2017-12-27 DIAGNOSIS — E1149 Type 2 diabetes mellitus with other diabetic neurological complication: Secondary | ICD-10-CM | POA: Diagnosis not present

## 2017-12-27 DIAGNOSIS — E039 Hypothyroidism, unspecified: Secondary | ICD-10-CM | POA: Diagnosis not present

## 2018-01-03 DIAGNOSIS — E1149 Type 2 diabetes mellitus with other diabetic neurological complication: Secondary | ICD-10-CM | POA: Diagnosis not present

## 2018-01-03 DIAGNOSIS — I10 Essential (primary) hypertension: Secondary | ICD-10-CM | POA: Diagnosis not present

## 2018-01-03 DIAGNOSIS — E1139 Type 2 diabetes mellitus with other diabetic ophthalmic complication: Secondary | ICD-10-CM | POA: Diagnosis not present

## 2018-01-03 DIAGNOSIS — Z0000 Encounter for general adult medical examination without abnormal findings: Secondary | ICD-10-CM | POA: Diagnosis not present

## 2018-01-03 DIAGNOSIS — E7849 Other hyperlipidemia: Secondary | ICD-10-CM | POA: Diagnosis not present

## 2018-01-03 DIAGNOSIS — Z1389 Encounter for screening for other disorder: Secondary | ICD-10-CM | POA: Diagnosis not present

## 2018-04-25 DIAGNOSIS — E113292 Type 2 diabetes mellitus with mild nonproliferative diabetic retinopathy without macular edema, left eye: Secondary | ICD-10-CM | POA: Diagnosis not present

## 2018-04-25 DIAGNOSIS — H5213 Myopia, bilateral: Secondary | ICD-10-CM | POA: Diagnosis not present

## 2018-05-03 DIAGNOSIS — E278 Other specified disorders of adrenal gland: Secondary | ICD-10-CM | POA: Diagnosis not present

## 2018-05-03 DIAGNOSIS — E7201 Cystinuria: Secondary | ICD-10-CM | POA: Diagnosis not present

## 2018-05-03 DIAGNOSIS — N200 Calculus of kidney: Secondary | ICD-10-CM | POA: Diagnosis not present

## 2018-05-03 DIAGNOSIS — N261 Atrophy of kidney (terminal): Secondary | ICD-10-CM | POA: Diagnosis not present

## 2018-05-03 DIAGNOSIS — R82998 Other abnormal findings in urine: Secondary | ICD-10-CM | POA: Diagnosis not present

## 2018-05-03 DIAGNOSIS — K760 Fatty (change of) liver, not elsewhere classified: Secondary | ICD-10-CM | POA: Diagnosis not present

## 2018-05-03 DIAGNOSIS — R162 Hepatomegaly with splenomegaly, not elsewhere classified: Secondary | ICD-10-CM | POA: Diagnosis not present

## 2018-06-23 DIAGNOSIS — N200 Calculus of kidney: Secondary | ICD-10-CM | POA: Diagnosis not present

## 2018-07-10 DIAGNOSIS — N183 Chronic kidney disease, stage 3 (moderate): Secondary | ICD-10-CM | POA: Diagnosis not present

## 2018-07-10 DIAGNOSIS — I10 Essential (primary) hypertension: Secondary | ICD-10-CM | POA: Diagnosis not present

## 2018-07-10 DIAGNOSIS — E1139 Type 2 diabetes mellitus with other diabetic ophthalmic complication: Secondary | ICD-10-CM | POA: Diagnosis not present

## 2018-07-10 DIAGNOSIS — E1149 Type 2 diabetes mellitus with other diabetic neurological complication: Secondary | ICD-10-CM | POA: Diagnosis not present

## 2018-08-04 IMAGING — DX DG LUMBAR SPINE 2-3V
3 series · 3 of 3 positions shown · non-contrast
Comparison: None.

CLINICAL DATA: Preop evaluation for upcoming lumbar surgery

EXAM:
LUMBAR SPINE - 3 VIEW

[l-spine ap]
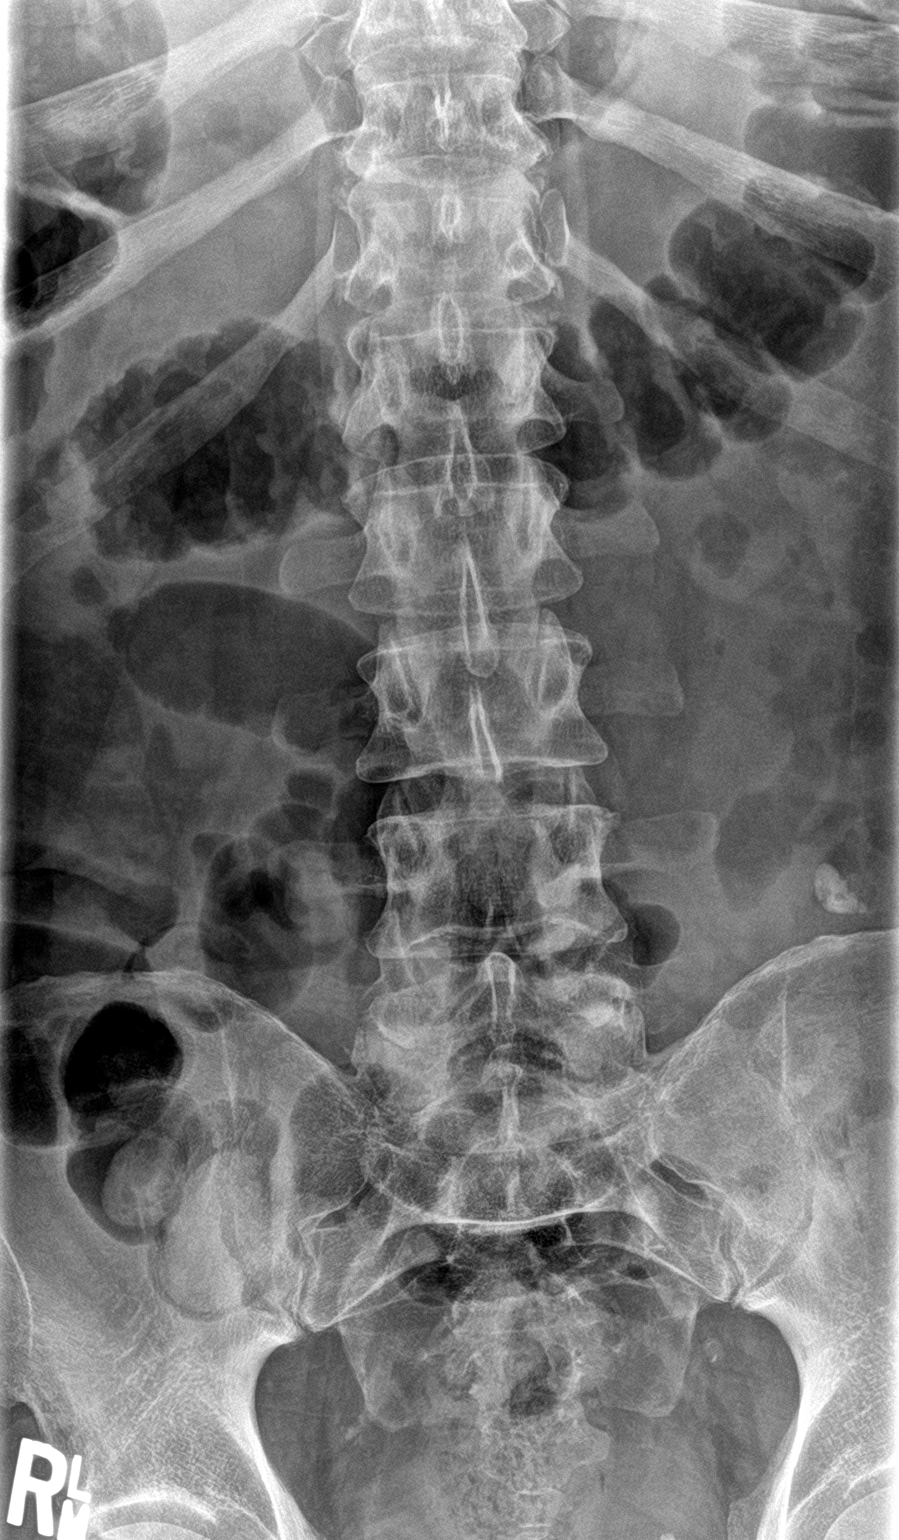

[l-spine lat]
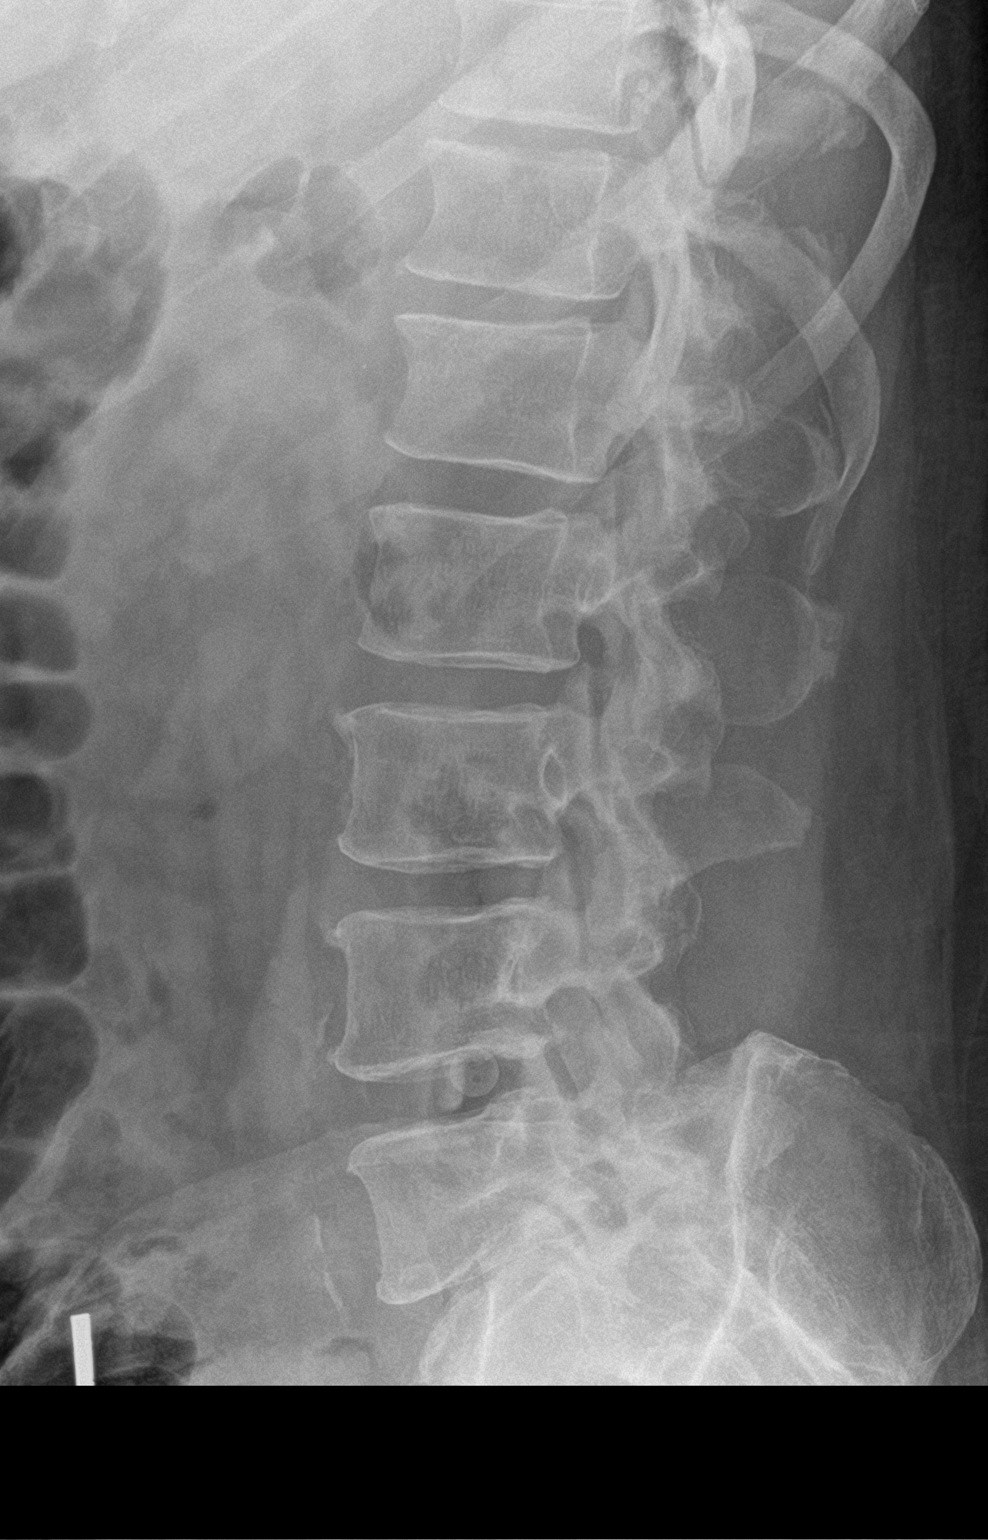

[l-spine spot]
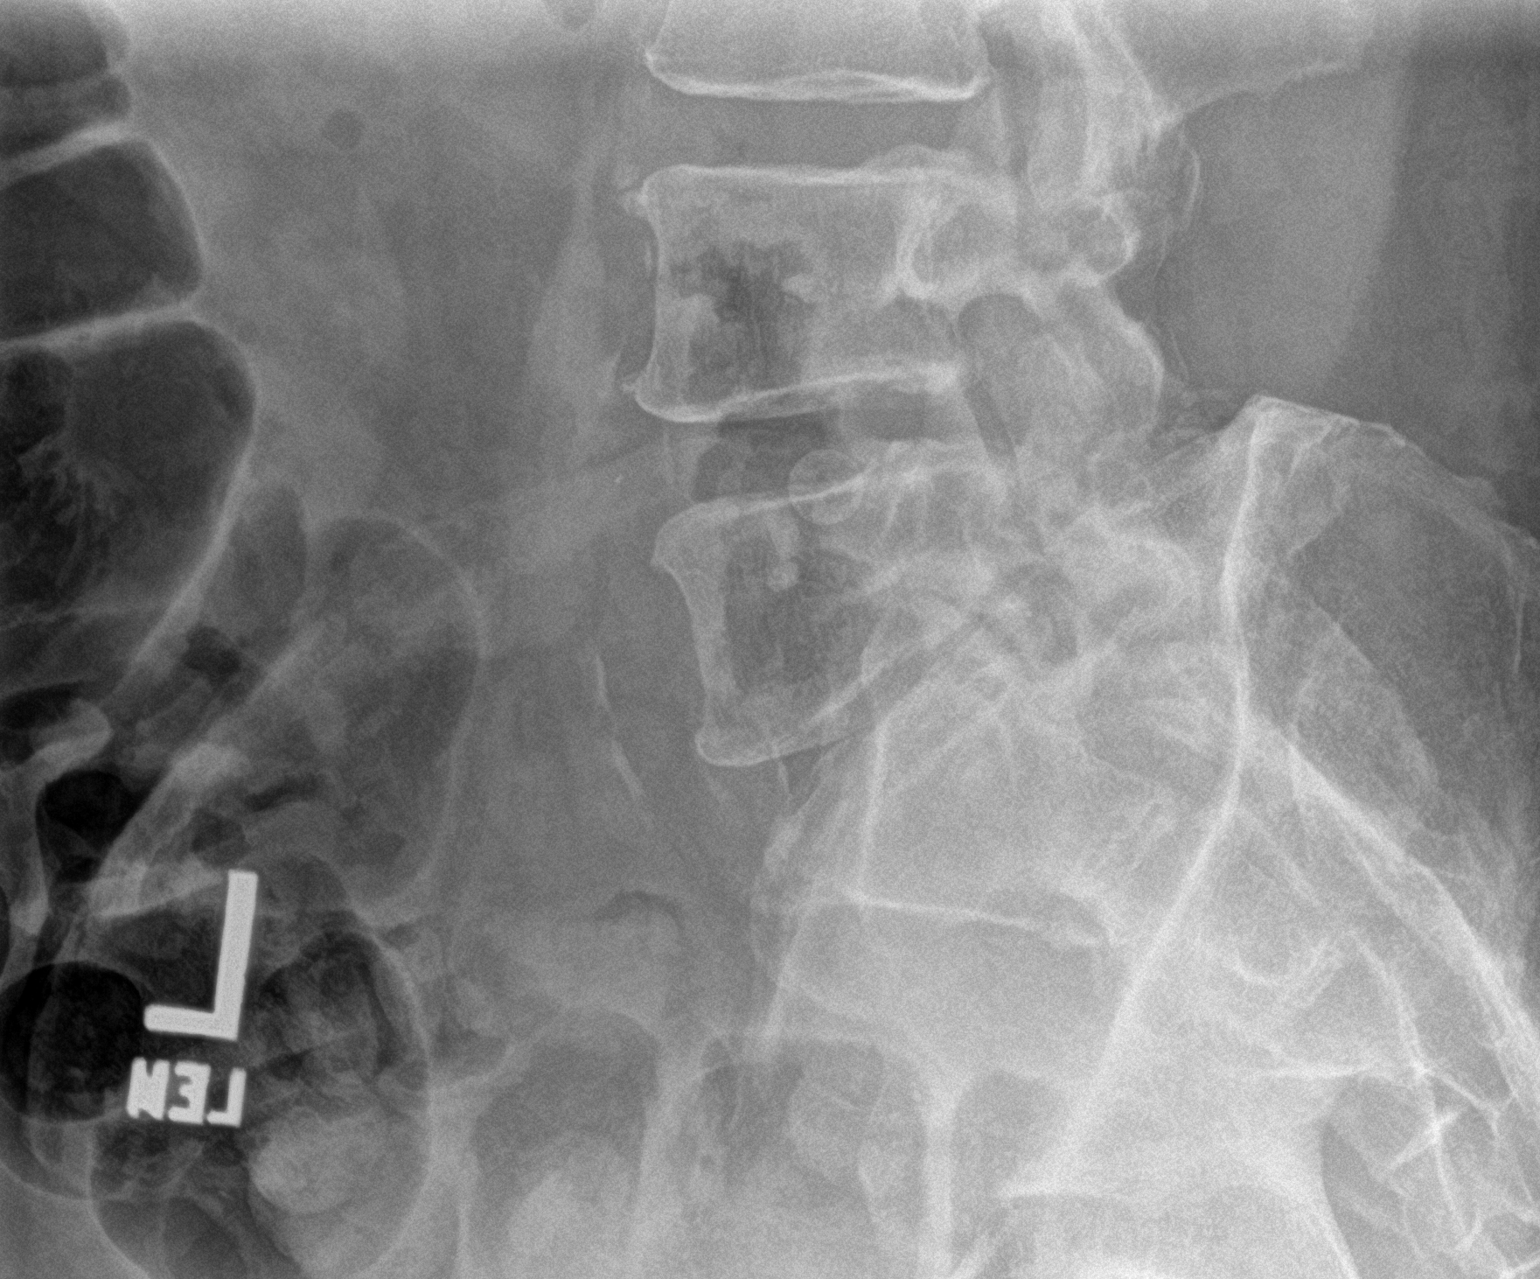

[3 of 3 positions shown; findings below may reference images not displayed]

FINDINGS: Five lumbar type vertebral bodies are well visualized. Vertebral
body height is well maintained. Mild osteophytic changes are seen.
No anterolisthesis is noted. Mild aortic calcifications are seen.
IMPRESSION: Mild degenerative change as described.

## 2018-09-14 DIAGNOSIS — R05 Cough: Secondary | ICD-10-CM | POA: Diagnosis not present

## 2018-09-14 DIAGNOSIS — R0989 Other specified symptoms and signs involving the circulatory and respiratory systems: Secondary | ICD-10-CM | POA: Diagnosis not present

## 2018-09-14 DIAGNOSIS — J209 Acute bronchitis, unspecified: Secondary | ICD-10-CM | POA: Diagnosis not present

## 2019-02-25 DIAGNOSIS — I10 Essential (primary) hypertension: Secondary | ICD-10-CM | POA: Diagnosis not present

## 2019-02-25 DIAGNOSIS — R82998 Other abnormal findings in urine: Secondary | ICD-10-CM | POA: Diagnosis not present

## 2019-02-25 DIAGNOSIS — Z0000 Encounter for general adult medical examination without abnormal findings: Secondary | ICD-10-CM | POA: Diagnosis not present

## 2019-02-25 DIAGNOSIS — E7849 Other hyperlipidemia: Secondary | ICD-10-CM | POA: Diagnosis not present

## 2019-02-25 DIAGNOSIS — Z125 Encounter for screening for malignant neoplasm of prostate: Secondary | ICD-10-CM | POA: Diagnosis not present

## 2019-02-25 DIAGNOSIS — M109 Gout, unspecified: Secondary | ICD-10-CM | POA: Diagnosis not present

## 2019-02-25 DIAGNOSIS — E1149 Type 2 diabetes mellitus with other diabetic neurological complication: Secondary | ICD-10-CM | POA: Diagnosis not present

## 2019-03-04 DIAGNOSIS — E1139 Type 2 diabetes mellitus with other diabetic ophthalmic complication: Secondary | ICD-10-CM | POA: Diagnosis not present

## 2019-03-04 DIAGNOSIS — E785 Hyperlipidemia, unspecified: Secondary | ICD-10-CM | POA: Diagnosis not present

## 2019-03-04 DIAGNOSIS — I10 Essential (primary) hypertension: Secondary | ICD-10-CM | POA: Diagnosis not present

## 2019-03-04 DIAGNOSIS — Z0000 Encounter for general adult medical examination without abnormal findings: Secondary | ICD-10-CM | POA: Diagnosis not present

## 2019-03-04 DIAGNOSIS — E1149 Type 2 diabetes mellitus with other diabetic neurological complication: Secondary | ICD-10-CM | POA: Diagnosis not present

## 2019-06-06 DIAGNOSIS — K8020 Calculus of gallbladder without cholecystitis without obstruction: Secondary | ICD-10-CM | POA: Diagnosis not present

## 2019-06-06 DIAGNOSIS — N200 Calculus of kidney: Secondary | ICD-10-CM | POA: Diagnosis not present

## 2019-06-06 DIAGNOSIS — E7201 Cystinuria: Secondary | ICD-10-CM | POA: Diagnosis not present

## 2019-06-18 ENCOUNTER — Other Ambulatory Visit: Payer: Self-pay

## 2019-06-18 DIAGNOSIS — Z20822 Contact with and (suspected) exposure to covid-19: Secondary | ICD-10-CM

## 2019-06-19 LAB — NOVEL CORONAVIRUS, NAA: SARS-CoV-2, NAA: DETECTED — AB

## 2019-06-27 ENCOUNTER — Other Ambulatory Visit: Payer: Self-pay

## 2019-06-27 DIAGNOSIS — Z20822 Contact with and (suspected) exposure to covid-19: Secondary | ICD-10-CM

## 2019-06-30 LAB — NOVEL CORONAVIRUS, NAA: SARS-CoV-2, NAA: DETECTED — AB

## 2019-07-16 DIAGNOSIS — E1139 Type 2 diabetes mellitus with other diabetic ophthalmic complication: Secondary | ICD-10-CM | POA: Diagnosis not present

## 2019-07-16 DIAGNOSIS — E1149 Type 2 diabetes mellitus with other diabetic neurological complication: Secondary | ICD-10-CM | POA: Diagnosis not present

## 2019-07-16 DIAGNOSIS — I10 Essential (primary) hypertension: Secondary | ICD-10-CM | POA: Diagnosis not present

## 2019-07-16 DIAGNOSIS — U071 COVID-19: Secondary | ICD-10-CM | POA: Diagnosis not present

## 2019-07-18 DIAGNOSIS — Z23 Encounter for immunization: Secondary | ICD-10-CM | POA: Diagnosis not present

## 2019-08-27 DIAGNOSIS — Z20828 Contact with and (suspected) exposure to other viral communicable diseases: Secondary | ICD-10-CM | POA: Diagnosis not present

## 2019-10-15 DIAGNOSIS — H5213 Myopia, bilateral: Secondary | ICD-10-CM | POA: Diagnosis not present

## 2019-10-15 DIAGNOSIS — H2513 Age-related nuclear cataract, bilateral: Secondary | ICD-10-CM | POA: Diagnosis not present

## 2019-10-15 DIAGNOSIS — E103292 Type 1 diabetes mellitus with mild nonproliferative diabetic retinopathy without macular edema, left eye: Secondary | ICD-10-CM | POA: Diagnosis not present

## 2019-10-20 DIAGNOSIS — L821 Other seborrheic keratosis: Secondary | ICD-10-CM | POA: Diagnosis not present

## 2019-10-20 DIAGNOSIS — D2261 Melanocytic nevi of right upper limb, including shoulder: Secondary | ICD-10-CM | POA: Diagnosis not present

## 2019-10-20 DIAGNOSIS — D225 Melanocytic nevi of trunk: Secondary | ICD-10-CM | POA: Diagnosis not present

## 2019-10-20 DIAGNOSIS — D2262 Melanocytic nevi of left upper limb, including shoulder: Secondary | ICD-10-CM | POA: Diagnosis not present

## 2019-12-19 ENCOUNTER — Other Ambulatory Visit: Payer: Self-pay

## 2019-12-19 ENCOUNTER — Ambulatory Visit (INDEPENDENT_AMBULATORY_CARE_PROVIDER_SITE_OTHER): Payer: BC Managed Care – PPO | Admitting: Plastic Surgery

## 2019-12-19 ENCOUNTER — Encounter: Payer: Self-pay | Admitting: Plastic Surgery

## 2019-12-19 DIAGNOSIS — R220 Localized swelling, mass and lump, head: Secondary | ICD-10-CM

## 2019-12-19 NOTE — Progress Notes (Signed)
Patient ID: Jorge Dyer, male    DOB: 06-06-1966, 54 y.o.   MRN: [id number]   Chief Complaint  Patient presents with  . Skin Problem    The patient is a 54 year old white male here for evaluation of a scalp lesion.  The patient has noticed a mass on the posterior left occipital area for many years.  It has gotten significantly larger.  His wife has been concerned and encouraged him to have it taken care of.  It is approximately 4 cm in size.  It is slightly firm.  Not very mobile.  It is consistent with a sebaceous cyst type of a lesion.  He is otherwise in stable health has diabetes.  His last hemoglobin A1c was six-point.  He also has, hyperlipidemia, hypertension and kidney disease.  The area does not appear to be infected.   Review of Systems  Constitutional: Negative.  Negative for appetite change.  HENT: Negative.   Eyes: Negative.   Respiratory: Negative.   Cardiovascular: Negative.   Gastrointestinal: Negative.   Endocrine: Negative.   Genitourinary: Negative.   Musculoskeletal: Negative.   Skin: Negative.  Negative for wound.  Hematological: Negative.   Psychiatric/Behavioral: Negative.     Past Medical History:  Diagnosis Date  . Arthritis    Patient denies  . Chronic kidney disease    h/o kidney stones  . 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 Diabetes mellitus    type 2  . Gout   . History of kidney stones   . HNP (herniated nucleus pulposus)   . Hyperlipidemia   . Hypothyroidism     Past Surgical History:  Procedure Laterality Date  . APPENDECTOMY  1993  . BACK SURGERY  2010   lower L 4 and L 5  . DECOMPRESSIVE LUMBAR LAMINECTOMY LEVEL 1 Right 08/11/2016   Procedure: MICRO LUMBAR DECOMPRESSION L5-S1 ON THE RIGHT  LEVEL 1;  Surgeon: Susa Day, MD;  Location: [facility];  Service: Orthopedics;  Laterality: Right;  Requesting 2 hours  . KIDNEY STONE SURGERY     percutaneous nephrolithomy x 2      Current Outpatient Medications:  .  allopurinol (ZYLOPRIM) 100 MG  tablet, Take 100 mg by mouth daily., Disp: , Rfl:  .  Dulaglutide (TRULICITY) 1.5 0000000 SOPN, Inject 1.5 mg into the skin once a week. Thursdays, Disp: , Rfl:  .  levothyroxine (SYNTHROID, LEVOTHROID) 125 MCG tablet, Take 125 mcg by mouth daily before breakfast., Disp: , Rfl:  .  losartan-hydrochlorothiazide (HYZAAR) 100-25 MG per tablet, Take 1 tablet by mouth daily. , Disp: , Rfl:  .  simvastatin (ZOCOR) 40 MG tablet, , Disp: , Rfl: 7 .  tiopronin (THIOLA) 100 MG tablet, Take 200 mg by mouth 2 (two) times daily. Pt states he takes six 100 mg daily, Disp: , Rfl:  .  topiramate (TOPAMAX) 50 MG tablet, , Disp: , Rfl: 5   Objective:   Vitals:   12/19/19 0841  BP: 119/73  Pulse: 77  Temp: (!) 96.9 F (36.1 C)  SpO2: 100%    Physical Exam Vitals and nursing note reviewed.  Constitutional:      Appearance: Normal appearance.  HENT:     Head: Normocephalic and atraumatic.  Eyes:     Extraocular Movements: Extraocular movements intact.  Neck:   Cardiovascular:     Rate and Rhythm: Normal rate.     Pulses: Normal pulses.  Pulmonary:     Effort: Pulmonary effort is normal. No respiratory  distress.  Abdominal:     General: Abdomen is flat. There is no distension.  Skin:    General: Skin is warm.     Capillary Refill: Capillary refill takes less than 2 seconds.  Neurological:     General: No focal deficit present.     Mental Status: He is alert and oriented to person, place, and time.  Psychiatric:        Mood and Affect: Mood normal.        Behavior: Behavior normal.        Thought Content: Thought content normal.     Assessment & Plan:  No diagnosis found.  Recommend excision of scalp mass.  Would like to do this in the outpatient surgery center.  He most likely can go home the same day.  Pictures were obtained of the patient and placed in the chart with the patient's or guardian's permission.    [place], DO

## 2019-12-23 ENCOUNTER — Encounter: Payer: Self-pay | Admitting: Plastic Surgery

## 2019-12-25 NOTE — Progress Notes (Addendum)
ICD-10-CM   1. Mass of scalp  R22.0       Patient ID: Jorge Dyer, male    DOB: 1966-07-04, 54 y.o.   MRN: [id number]   History of Present Illness: Jorge Dyer is a 54 y.o.  male  with a history of a posterior scalp mass.  He presents for preoperative evaluation for upcoming procedure, excision of posterior scalp mass, scheduled for 01/15/20 with Dr. Marla Roe.  Summary from previous visit: Patient has mass on posterior left occipital area for many years that has gotten significantly larger. It is approximately 4 cm in size, slightly firm, not very mobile; consistent with sebaceous cyst type lesion.   PMH Significant for: Diabetes (well controlled - last A1c ~6%), HLD, HTN, hypothyroid, and kidney disease  The patient has not had problems with anesthesia.   Past Medical History: Allergies: No Known Allergies  Current Medications:  Current Outpatient Medications:  .  allopurinol (ZYLOPRIM) 100 MG tablet, Take 100 mg by mouth daily., Disp: , Rfl:  .  Dulaglutide (TRULICITY) 1.5 0000000 SOPN, Inject 1.5 mg into the skin once a week. Thursdays, Disp: , Rfl:  .  levothyroxine (SYNTHROID, LEVOTHROID) 125 MCG tablet, Take 125 mcg by mouth daily before breakfast., Disp: , Rfl:  .  losartan-hydrochlorothiazide (HYZAAR) 100-25 MG per tablet, Take 1 tablet by mouth daily. , Disp: , Rfl:  .  phentermine (ADIPEX-P) 37.5 MG tablet, Take 37.5 mg by mouth at bedtime., Disp: , Rfl:  .  simvastatin (ZOCOR) 40 MG tablet, , Disp: , Rfl: 7 .  tiopronin (THIOLA) 100 MG tablet, Take 200 mg by mouth 2 (two) times daily. Pt states he takes six 100 mg daily, Disp: , Rfl:  .  topiramate (TOPAMAX) 50 MG tablet, , Disp: , Rfl: 5  Past Medical Problems: Past Medical History:  Diagnosis Date  . Arthritis    Patient denies  . Chronic kidney disease    h/o kidney stones  . 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 Diabetes mellitus    type 2  . Gout   . History of kidney stones   . HNP (herniated nucleus pulposus)     . Hyperlipidemia   . Hypothyroidism     Past Surgical History: Past Surgical History:  Procedure Laterality Date  . APPENDECTOMY  1993  . BACK SURGERY  2010   lower L 4 and L 5  . DECOMPRESSIVE LUMBAR LAMINECTOMY LEVEL 1 Right 08/11/2016   Procedure: MICRO LUMBAR DECOMPRESSION L5-S1 ON THE RIGHT  LEVEL 1;  Surgeon: Susa Day, MD;  Location: [facility];  Service: Orthopedics;  Laterality: Right;  Requesting 2 hours  . KIDNEY STONE SURGERY     percutaneous nephrolithomy x 2    Social History: Social History   Socioeconomic History  . Marital status: Married    Spouse name: Not on file  . Number of children: Not on file  . Years of education: Not on file  . Highest education level: Not on file  Occupational History  . Not on file  Tobacco Use  . Smoking status: Never Smoker  . Smokeless tobacco: Never Used  Substance and Sexual Activity  . Alcohol use: Yes    Comment: ocassionally  . Drug use: No  . Sexual activity: Yes  Other Topics Concern  . Not on file  Social History Narrative  . Not on file   Social Determinants of Health   Financial Resource Strain:   . Difficulty of Paying Living Expenses:  Food Insecurity:   . Worried About Charity fundraiser in the Last Year:   . Arboriculturist in the Last Year:   Transportation Needs:   . Film/video editor (Medical):   Marland Kitchen Lack of Transportation (Non-Medical):   Physical Activity:   . Days of Exercise per Week:   . Minutes of Exercise per Session:   Stress:   . Feeling of Stress :   Social Connections:   . Frequency of Communication with Friends and Family:   . Frequency of Social Gatherings with Friends and Family:   . Attends Religious Services:   . Active Member of Clubs or Organizations:   . Attends Archivist Meetings:   Marland Kitchen Marital Status:   Intimate Partner Violence:   . Fear of Current or Ex-Partner:   . Emotionally Abused:   Marland Kitchen Physically Abused:   . Sexually Abused:     Family  History: Family History  Problem Relation Age of Onset  . Colon cancer Neg Hx   . Colon polyps Neg Hx   . Esophageal cancer Neg Hx   . Rectal cancer Neg Hx   . Stomach cancer Neg Hx     Review of Systems: Review of Systems  Constitutional: Negative for chills and fever.  HENT: Negative for congestion and sore throat.   Respiratory: Negative for cough and shortness of breath.   Cardiovascular: Negative for chest pain and palpitations.  Gastrointestinal: Negative for abdominal pain, nausea and vomiting.  Musculoskeletal: Negative for back pain, joint pain, myalgias and neck pain.  Skin: Negative for itching and rash.       Lump on left posterior scalp    Physical Exam: Vital Signs BP 130/79   Pulse 65   Temp (!) 97.1 F (36.2 C) (Temporal)   Ht 6' (1.829 m)   Wt 269 lb (122 kg)   SpO2 99%   BMI 36.48 kg/m  Physical Exam Constitutional:      Appearance: Normal appearance.  HENT:     Head: Normocephalic and atraumatic.   Eyes:     Extraocular Movements: Extraocular movements intact.  Cardiovascular:     Rate and Rhythm: Normal rate and regular rhythm.  Pulmonary:     Effort: Pulmonary effort is normal.     Breath sounds: Normal breath sounds. No stridor. No wheezing, rhonchi or rales.  Abdominal:     General: Bowel sounds are normal.     Palpations: Abdomen is soft.  Musculoskeletal:        General: Normal range of motion.     Cervical back: Normal range of motion.  Skin:    General: Skin is warm and dry.  Neurological:     General: No focal deficit present.     Mental Status: He is alert and oriented to person, place, and time.  Psychiatric:        Mood and Affect: Mood normal.        Behavior: Behavior normal.        Thought Content: Thought content normal.        Judgment: Judgment normal.     Assessment/Plan:  Jorge Dyer scheduled for excision of posterior scalp mass with Dr. Marla Roe.  Risks, benefits, and alternatives of procedure discussed,  questions answered and consent obtained.    Smoking Status: non-smoker  Caprini Score: 3 Moderate; Risk Factors include: 54 yr-old male, BMI > 25, and length of planned surgery. Recommendation for mechanical or pharmacological prophylaxis during surgery. Encourage early  ambulation.   Pictures obtained: 12/19/19  Post-op Rx sent to pharmacy: Norco, Zofran, Keflex  Patient was provided with general surgical risk consent document prior to their appointment.  They had adequate time to read through the consent forms and we also discussed them in person together during this preop appointment.  All of their questions were answered to their content.  Recommended calling if they have any further questions.  Consent form to be scanned into patient's chart.  The [place] was signed into law in 2016 which includes the topic of electronic health records.  This provides immediate access to information in MyChart.  This includes consultation notes, operative notes, office notes, lab results and pathology reports.  If you have any questions about what you read please let us know at your next visit or call us at the office.  We are right here with you.   Electronically signed by: Threasa Heads, PA-C 12/29/2019 9:19 AM

## 2019-12-29 ENCOUNTER — Encounter: Payer: Self-pay | Admitting: Plastic Surgery

## 2019-12-29 ENCOUNTER — Ambulatory Visit (INDEPENDENT_AMBULATORY_CARE_PROVIDER_SITE_OTHER): Payer: BC Managed Care – PPO | Admitting: Plastic Surgery

## 2019-12-29 ENCOUNTER — Other Ambulatory Visit: Payer: Self-pay

## 2019-12-29 VITALS — BP 130/79 | HR 65 | Temp 97.1°F | Ht 72.0 in | Wt 269.0 lb

## 2019-12-29 DIAGNOSIS — R220 Localized swelling, mass and lump, head: Secondary | ICD-10-CM

## 2019-12-29 MED ORDER — ONDANSETRON HCL 4 MG PO TABS: 4.0000 mg | ORAL_TABLET | Freq: Three times a day (TID) | ORAL | 0 refills | Status: DC | PRN

## 2019-12-29 MED ORDER — CEPHALEXIN 500 MG PO CAPS: 500.0000 mg | ORAL_CAPSULE | Freq: Four times a day (QID) | ORAL | 0 refills | Status: AC

## 2019-12-29 MED ORDER — HYDROCODONE-ACETAMINOPHEN 5-325 MG PO TABS: 1.0000 | ORAL_TABLET | Freq: Three times a day (TID) | ORAL | 0 refills | Status: AC | PRN

## 2020-01-08 ENCOUNTER — Encounter (HOSPITAL_BASED_OUTPATIENT_CLINIC_OR_DEPARTMENT_OTHER): Payer: Self-pay | Admitting: Plastic Surgery

## 2020-01-08 ENCOUNTER — Other Ambulatory Visit: Payer: Self-pay

## 2020-01-13 ENCOUNTER — Other Ambulatory Visit (HOSPITAL_COMMUNITY): Payer: BC Managed Care – PPO

## 2020-01-29 ENCOUNTER — Encounter: Payer: BC Managed Care – PPO | Admitting: Plastic Surgery

## 2020-02-03 ENCOUNTER — Encounter: Payer: Self-pay | Admitting: Plastic Surgery

## 2020-02-05 ENCOUNTER — Other Ambulatory Visit: Payer: Self-pay

## 2020-02-05 ENCOUNTER — Encounter (HOSPITAL_BASED_OUTPATIENT_CLINIC_OR_DEPARTMENT_OTHER): Payer: Self-pay | Admitting: Plastic Surgery

## 2020-02-07 ENCOUNTER — Other Ambulatory Visit (HOSPITAL_COMMUNITY): Payer: BC Managed Care – PPO

## 2020-02-09 ENCOUNTER — Other Ambulatory Visit (HOSPITAL_COMMUNITY)
Admission: RE | Admit: 2020-02-09 | Discharge: 2020-02-09 | Disposition: A | Discharge status: Home / Self Care | Payer: BC Managed Care – PPO | Source: Ambulatory Visit | Attending: Plastic Surgery | Admitting: Plastic Surgery

## 2020-02-09 ENCOUNTER — Encounter (HOSPITAL_BASED_OUTPATIENT_CLINIC_OR_DEPARTMENT_OTHER)
Admission: RE | Admit: 2020-02-09 | Discharge: 2020-02-09 | Disposition: A | Discharge status: Home / Self Care | Payer: BC Managed Care – PPO | Source: Ambulatory Visit | Attending: Plastic Surgery | Admitting: Plastic Surgery

## 2020-02-09 DIAGNOSIS — Z6835 Body mass index (BMI) 35.0-35.9, adult: Secondary | ICD-10-CM | POA: Diagnosis not present

## 2020-02-09 DIAGNOSIS — Z87442 Personal history of urinary calculi: Secondary | ICD-10-CM | POA: Diagnosis not present

## 2020-02-09 DIAGNOSIS — M109 Gout, unspecified: Secondary | ICD-10-CM | POA: Diagnosis not present

## 2020-02-09 DIAGNOSIS — M1990 Unspecified osteoarthritis, unspecified site: Secondary | ICD-10-CM | POA: Diagnosis not present

## 2020-02-09 DIAGNOSIS — Z79899 Other long term (current) drug therapy: Secondary | ICD-10-CM | POA: Diagnosis not present

## 2020-02-09 DIAGNOSIS — E039 Hypothyroidism, unspecified: Secondary | ICD-10-CM | POA: Diagnosis not present

## 2020-02-09 DIAGNOSIS — E119 Type 2 diabetes mellitus without complications: Secondary | ICD-10-CM | POA: Diagnosis not present

## 2020-02-09 DIAGNOSIS — D234 Other benign neoplasm of skin of scalp and neck: Secondary | ICD-10-CM | POA: Diagnosis not present

## 2020-02-09 DIAGNOSIS — I10 Essential (primary) hypertension: Secondary | ICD-10-CM | POA: Diagnosis not present

## 2020-02-09 DIAGNOSIS — Z01812 Encounter for preprocedural laboratory examination: Secondary | ICD-10-CM | POA: Insufficient documentation

## 2020-02-09 DIAGNOSIS — Z20822 Contact with and (suspected) exposure to covid-19: Secondary | ICD-10-CM | POA: Diagnosis not present

## 2020-02-09 DIAGNOSIS — E785 Hyperlipidemia, unspecified: Secondary | ICD-10-CM | POA: Diagnosis not present

## 2020-02-09 DIAGNOSIS — R220 Localized swelling, mass and lump, head: Secondary | ICD-10-CM | POA: Diagnosis not present

## 2020-02-09 DIAGNOSIS — E669 Obesity, unspecified: Secondary | ICD-10-CM | POA: Diagnosis not present

## 2020-02-09 LAB — BASIC METABOLIC PANEL
Anion gap: 9 (ref 5–15)
BUN: 33 mg/dL — ABNORMAL HIGH (ref 6–20)
CO2: 21 mmol/L — ABNORMAL LOW (ref 22–32)
Calcium: 9.1 mg/dL (ref 8.9–10.3)
Chloride: 107 mmol/L (ref 98–111)
Creatinine, Ser: 1.85 mg/dL — ABNORMAL HIGH (ref 0.61–1.24)
GFR calc Af Amer: 46 mL/min — ABNORMAL LOW (ref >60)
GFR calc non Af Amer: 40 mL/min — ABNORMAL LOW (ref >60)
Glucose, Bld: 172 mg/dL — ABNORMAL HIGH (ref 70–99)
Potassium: 4.7 mmol/L (ref 3.5–5.1)
Sodium: 137 mmol/L (ref 135–145)

## 2020-02-09 LAB — SARS CORONAVIRUS 2 (TAT 6-24 HRS): SARS Coronavirus 2: NEGATIVE

## 2020-02-09 NOTE — Progress Notes (Signed)
Surgical soap given with instructions, pt verbalized understanding.  

## 2020-02-11 ENCOUNTER — Encounter (HOSPITAL_BASED_OUTPATIENT_CLINIC_OR_DEPARTMENT_OTHER)
Admission: RE | Disposition: A | Discharge status: Home / Self Care | Payer: Self-pay | Source: Home / Self Care | Attending: Plastic Surgery

## 2020-02-11 ENCOUNTER — Encounter (HOSPITAL_BASED_OUTPATIENT_CLINIC_OR_DEPARTMENT_OTHER): Payer: Self-pay | Admitting: Plastic Surgery

## 2020-02-11 ENCOUNTER — Ambulatory Visit (HOSPITAL_BASED_OUTPATIENT_CLINIC_OR_DEPARTMENT_OTHER)
Admission: RE | Admit: 2020-02-11 | Discharge: 2020-02-11 | Disposition: A | Discharge status: Home / Self Care | Payer: BC Managed Care – PPO | Attending: Plastic Surgery | Admitting: Plastic Surgery

## 2020-02-11 ENCOUNTER — Other Ambulatory Visit: Payer: Self-pay

## 2020-02-11 ENCOUNTER — Ambulatory Visit (HOSPITAL_BASED_OUTPATIENT_CLINIC_OR_DEPARTMENT_OTHER): Payer: BC Managed Care – PPO | Admitting: Anesthesiology

## 2020-02-11 DIAGNOSIS — M109 Gout, unspecified: Secondary | ICD-10-CM | POA: Diagnosis not present

## 2020-02-11 DIAGNOSIS — E669 Obesity, unspecified: Secondary | ICD-10-CM | POA: Diagnosis not present

## 2020-02-11 DIAGNOSIS — Z87442 Personal history of urinary calculi: Secondary | ICD-10-CM | POA: Diagnosis not present

## 2020-02-11 DIAGNOSIS — I10 Essential (primary) hypertension: Secondary | ICD-10-CM | POA: Insufficient documentation

## 2020-02-11 DIAGNOSIS — M1990 Unspecified osteoarthritis, unspecified site: Secondary | ICD-10-CM | POA: Insufficient documentation

## 2020-02-11 DIAGNOSIS — Z20822 Contact with and (suspected) exposure to covid-19: Secondary | ICD-10-CM | POA: Insufficient documentation

## 2020-02-11 DIAGNOSIS — Z6835 Body mass index (BMI) 35.0-35.9, adult: Secondary | ICD-10-CM | POA: Diagnosis not present

## 2020-02-11 DIAGNOSIS — E039 Hypothyroidism, unspecified: Secondary | ICD-10-CM | POA: Diagnosis not present

## 2020-02-11 DIAGNOSIS — D234 Other benign neoplasm of skin of scalp and neck: Secondary | ICD-10-CM | POA: Insufficient documentation

## 2020-02-11 DIAGNOSIS — E119 Type 2 diabetes mellitus without complications: Secondary | ICD-10-CM | POA: Insufficient documentation

## 2020-02-11 DIAGNOSIS — R220 Localized swelling, mass and lump, head: Secondary | ICD-10-CM | POA: Diagnosis not present

## 2020-02-11 DIAGNOSIS — Z79899 Other long term (current) drug therapy: Secondary | ICD-10-CM | POA: Insufficient documentation

## 2020-02-11 DIAGNOSIS — E785 Hyperlipidemia, unspecified: Secondary | ICD-10-CM | POA: Diagnosis not present

## 2020-02-11 HISTORY — PX: EXCISION MASS HEAD: SHX6702

## 2020-02-11 HISTORY — DX: Essential (primary) hypertension: I10

## 2020-02-11 LAB — GLUCOSE, CAPILLARY
Glucose-Capillary: 163 mg/dL — ABNORMAL HIGH (ref 70–99)
Glucose-Capillary: 165 mg/dL — ABNORMAL HIGH (ref 70–99)

## 2020-02-11 SURGERY — EXCISION, MASS, HEAD
Anesthesia: General | Site: Scalp

## 2020-02-11 MED ORDER — SODIUM CHLORIDE 0.9% FLUSH: 3.0000 mL | INTRAVENOUS | Status: DC | PRN

## 2020-02-11 MED ORDER — AMISULPRIDE (ANTIEMETIC) 5 MG/2ML IV SOLN: 10.0000 mg | Freq: Once | INTRAVENOUS | Status: DC | PRN

## 2020-02-11 MED ORDER — SUGAMMADEX SODIUM 500 MG/5ML IV SOLN
INTRAVENOUS | Status: DC | PRN
  Administered 2020-02-11: 475 mg via INTRAVENOUS

## 2020-02-11 MED ORDER — FENTANYL CITRATE (PF) 100 MCG/2ML IJ SOLN
INTRAMUSCULAR | Status: DC | PRN
  Administered 2020-02-11 (×2): 50 ug via INTRAVENOUS

## 2020-02-11 MED ORDER — SODIUM CHLORIDE 0.9 % IV SOLN: 250.0000 mL | INTRAVENOUS | Status: DC | PRN

## 2020-02-11 MED ORDER — DEXAMETHASONE SODIUM PHOSPHATE 4 MG/ML IJ SOLN
INTRAMUSCULAR | Status: DC | PRN
  Administered 2020-02-11: 10 mg via INTRAVENOUS

## 2020-02-11 MED ORDER — PROPOFOL 10 MG/ML IV BOLUS
INTRAVENOUS | Status: DC | PRN
  Administered 2020-02-11: 200 mg via INTRAVENOUS

## 2020-02-11 MED ORDER — ONDANSETRON HCL 4 MG/2ML IJ SOLN
INTRAMUSCULAR | Status: AC
  Filled 2020-02-11: qty 2

## 2020-02-11 MED ORDER — LACTATED RINGERS IV SOLN: INTRAVENOUS | Status: DC

## 2020-02-11 MED ORDER — PHENYLEPHRINE 40 MCG/ML (10ML) SYRINGE FOR IV PUSH (FOR BLOOD PRESSURE SUPPORT)
PREFILLED_SYRINGE | INTRAVENOUS | Status: AC
  Filled 2020-02-11: qty 10

## 2020-02-11 MED ORDER — MIDAZOLAM HCL 2 MG/2ML IJ SOLN
INTRAMUSCULAR | Status: AC
  Filled 2020-02-11: qty 2

## 2020-02-11 MED ORDER — LIDOCAINE-EPINEPHRINE 1 %-1:100000 IJ SOLN
INTRAMUSCULAR | Status: DC | PRN
  Administered 2020-02-11: 1 mL

## 2020-02-11 MED ORDER — ONDANSETRON HCL 4 MG/2ML IJ SOLN
INTRAMUSCULAR | Status: DC | PRN
  Administered 2020-02-11: 4 mg via INTRAVENOUS

## 2020-02-11 MED ORDER — FENTANYL CITRATE (PF) 100 MCG/2ML IJ SOLN: 25.0000 ug | INTRAMUSCULAR | Status: DC | PRN

## 2020-02-11 MED ORDER — SODIUM CHLORIDE 0.9% FLUSH: 3.0000 mL | Freq: Two times a day (BID) | INTRAVENOUS | Status: DC

## 2020-02-11 MED ORDER — OXYCODONE HCL 5 MG PO TABS: 5.0000 mg | ORAL_TABLET | ORAL | Status: DC | PRN

## 2020-02-11 MED ORDER — BUPIVACAINE HCL (PF) 0.25 % IJ SOLN
INTRAMUSCULAR | Status: DC | PRN
Start: 2020-02-11 — End: 2020-02-11
  Administered 2020-02-11: 1 mL

## 2020-02-11 MED ORDER — MEPERIDINE HCL 25 MG/ML IJ SOLN: 6.2500 mg | INTRAMUSCULAR | Status: DC | PRN

## 2020-02-11 MED ORDER — MIDAZOLAM HCL 5 MG/5ML IJ SOLN
INTRAMUSCULAR | Status: DC | PRN
  Administered 2020-02-11: 2 mg via INTRAVENOUS

## 2020-02-11 MED ORDER — ACETAMINOPHEN 325 MG RE SUPP: 650.0000 mg | RECTAL | Status: DC | PRN

## 2020-02-11 MED ORDER — LIDOCAINE HCL (CARDIAC) PF 100 MG/5ML IV SOSY
PREFILLED_SYRINGE | INTRAVENOUS | Status: DC | PRN
  Administered 2020-02-11: 60 mg via INTRAVENOUS

## 2020-02-11 MED ORDER — CEFAZOLIN SODIUM-DEXTROSE 2-4 GM/100ML-% IV SOLN
2.0000 g | INTRAVENOUS | Status: AC
  Administered 2020-02-11: 2 g via INTRAVENOUS

## 2020-02-11 MED ORDER — ROCURONIUM BROMIDE 100 MG/10ML IV SOLN
INTRAVENOUS | Status: DC | PRN
  Administered 2020-02-11: 100 mg via INTRAVENOUS

## 2020-02-11 MED ORDER — FENTANYL CITRATE (PF) 100 MCG/2ML IJ SOLN
INTRAMUSCULAR | Status: AC
  Filled 2020-02-11: qty 2

## 2020-02-11 MED ORDER — EPHEDRINE 5 MG/ML INJ
INTRAVENOUS | Status: AC
  Filled 2020-02-11: qty 10

## 2020-02-11 MED ORDER — CHLORHEXIDINE GLUCONATE CLOTH 2 % EX PADS: 6.0000 | MEDICATED_PAD | Freq: Once | CUTANEOUS | Status: DC

## 2020-02-11 MED ORDER — SUGAMMADEX SODIUM 500 MG/5ML IV SOLN
INTRAVENOUS | Status: AC
  Filled 2020-02-11: qty 5

## 2020-02-11 MED ORDER — ACETAMINOPHEN 325 MG PO TABS: 650.0000 mg | ORAL_TABLET | ORAL | Status: DC | PRN

## 2020-02-11 MED ORDER — OXYCODONE HCL 5 MG/5ML PO SOLN: 5.0000 mg | Freq: Once | ORAL | Status: DC | PRN

## 2020-02-11 MED ORDER — LIDOCAINE 2% (20 MG/ML) 5 ML SYRINGE
INTRAMUSCULAR | Status: AC
  Filled 2020-02-11: qty 5

## 2020-02-11 MED ORDER — CEFAZOLIN SODIUM-DEXTROSE 2-4 GM/100ML-% IV SOLN
INTRAVENOUS | Status: AC
  Filled 2020-02-11: qty 100

## 2020-02-11 MED ORDER — SUCCINYLCHOLINE CHLORIDE 200 MG/10ML IV SOSY
PREFILLED_SYRINGE | INTRAVENOUS | Status: AC
  Filled 2020-02-11: qty 10

## 2020-02-11 MED ORDER — DIPHENHYDRAMINE HCL 50 MG/ML IJ SOLN
INTRAMUSCULAR | Status: DC | PRN
Start: 2020-02-11 — End: 2020-02-11
  Administered 2020-02-11: 6.25 mg via INTRAVENOUS

## 2020-02-11 MED ORDER — DIPHENHYDRAMINE HCL 50 MG/ML IJ SOLN
INTRAMUSCULAR | Status: AC
  Filled 2020-02-11: qty 1

## 2020-02-11 MED ORDER — HYDROMORPHONE HCL 1 MG/ML IJ SOLN: 0.2500 mg | INTRAMUSCULAR | Status: DC | PRN

## 2020-02-11 MED ORDER — PROPOFOL 500 MG/50ML IV EMUL
INTRAVENOUS | Status: AC
  Filled 2020-02-11: qty 50

## 2020-02-11 MED ORDER — PROMETHAZINE HCL 25 MG/ML IJ SOLN: 6.2500 mg | INTRAMUSCULAR | Status: DC | PRN

## 2020-02-11 MED ORDER — OXYCODONE HCL 5 MG PO TABS: 5.0000 mg | ORAL_TABLET | Freq: Once | ORAL | Status: DC | PRN

## 2020-02-11 MED ORDER — DEXAMETHASONE SODIUM PHOSPHATE 10 MG/ML IJ SOLN
INTRAMUSCULAR | Status: AC
  Filled 2020-02-11: qty 1

## 2020-02-11 SURGICAL SUPPLY — 53 items
BLADE CLIPPER SURG (BLADE) IMPLANT
BLADE SURG 15 STRL LF DISP TIS (BLADE) ×1 IMPLANT
BLADE SURG 15 STRL SS (BLADE) ×2
BNDG CONFORM 2 STRL LF (GAUZE/BANDAGES/DRESSINGS) IMPLANT
BNDG ELASTIC 2X5.8 VLCR STR LF (GAUZE/BANDAGES/DRESSINGS) IMPLANT
CANISTER SUCT 1200ML W/VALVE (MISCELLANEOUS) IMPLANT
CORD BIPOLAR FORCEPS 12FT (ELECTRODE) IMPLANT
COVER BACK TABLE 60X90IN (DRAPES) ×2 IMPLANT
COVER MAYO STAND STRL (DRAPES) ×2 IMPLANT
COVER WAND RF STERILE (DRAPES) IMPLANT
DECANTER SPIKE VIAL GLASS SM (MISCELLANEOUS) IMPLANT
DERMABOND ADVANCED (GAUZE/BANDAGES/DRESSINGS)
DERMABOND ADVANCED .7 DNX12 (GAUZE/BANDAGES/DRESSINGS) IMPLANT
DRAPE LAPAROTOMY 100X72 PEDS (DRAPES) IMPLANT
DRAPE U-SHAPE 76X120 STRL (DRAPES) IMPLANT
DRSG TEGADERM 2-3/8X2-3/4 SM (GAUZE/BANDAGES/DRESSINGS) IMPLANT
ELECT NEEDLE BLADE 2-5/6 (NEEDLE) IMPLANT
ELECT REM PT RETURN 9FT ADLT (ELECTROSURGICAL) ×2
ELECT REM PT RETURN 9FT PED (ELECTROSURGICAL)
ELECTRODE REM PT RETRN 9FT PED (ELECTROSURGICAL) IMPLANT
ELECTRODE REM PT RTRN 9FT ADLT (ELECTROSURGICAL) ×1 IMPLANT
GAUZE SPONGE 4X4 12PLY STRL LF (GAUZE/BANDAGES/DRESSINGS) IMPLANT
GAUZE XEROFORM 1X8 LF (GAUZE/BANDAGES/DRESSINGS) IMPLANT
GLOVE BIO SURGEON STRL SZ 6.5 (GLOVE) ×4 IMPLANT
GOWN STRL REUS W/ TWL LRG LVL3 (GOWN DISPOSABLE) ×2 IMPLANT
GOWN STRL REUS W/TWL LRG LVL3 (GOWN DISPOSABLE) ×4
NEEDLE HYPO 30GX1 BEV (NEEDLE) ×2 IMPLANT
NEEDLE PRECISIONGLIDE 27X1.5 (NEEDLE) IMPLANT
NS IRRIG 1000ML POUR BTL (IV SOLUTION) IMPLANT
PENCIL SMOKE EVACUATOR (MISCELLANEOUS) ×2 IMPLANT
SET BASIN DAY SURGERY F.S. (CUSTOM PROCEDURE TRAY) ×2 IMPLANT
SHEET MEDIUM DRAPE 40X70 STRL (DRAPES) ×2 IMPLANT
SPONGE GAUZE 2X2 8PLY STRL LF (GAUZE/BANDAGES/DRESSINGS) IMPLANT
STRIP CLOSURE SKIN 1/2X4 (GAUZE/BANDAGES/DRESSINGS) IMPLANT
SUCTION FRAZIER HANDLE 10FR (MISCELLANEOUS) ×2
SUCTION TUBE FRAZIER 10FR DISP (MISCELLANEOUS) ×1 IMPLANT
SUT MNCRL 6-0 UNDY P1 1X18 (SUTURE) IMPLANT
SUT MNCRL AB 4-0 PS2 18 (SUTURE) ×2 IMPLANT
SUT MON AB 5-0 P3 18 (SUTURE) IMPLANT
SUT MON AB 5-0 PS2 18 (SUTURE) IMPLANT
SUT MONOCRYL 6-0 P1 1X18 (SUTURE)
SUT PROLENE 5 0 P 3 (SUTURE) IMPLANT
SUT PROLENE 5 0 PS 2 (SUTURE) IMPLANT
SUT PROLENE 6 0 P 1 18 (SUTURE) IMPLANT
SUT VIC AB 5-0 P-3 18X BRD (SUTURE) IMPLANT
SUT VIC AB 5-0 P3 18 (SUTURE)
SUT VIC AB 5-0 PS2 18 (SUTURE) IMPLANT
SUT VICRYL 4-0 PS2 18IN ABS (SUTURE) ×2 IMPLANT
SYR BULB EAR ULCER 3OZ GRN STR (SYRINGE) IMPLANT
SYR CONTROL 10ML LL (SYRINGE) ×2 IMPLANT
TOWEL GREEN STERILE FF (TOWEL DISPOSABLE) ×2 IMPLANT
TRAY DSU PREP LF (CUSTOM PROCEDURE TRAY) ×2 IMPLANT
TUBE CONNECTING 20X1/4 (TUBING) IMPLANT

## 2020-02-11 NOTE — Transfer of Care (Signed)
Immediate Anesthesia Transfer of Care Note  Patient: Jorge Dyer  Procedure(s) Performed: Excision of posterior scalp mass (N/A Scalp)  Patient Location: PACU  Anesthesia Type:General  Level of Consciousness: awake, alert , oriented and drowsy  Airway & Oxygen Therapy: Patient Spontanous Breathing and Patient connected to nasal cannula oxygen  Post-op Assessment: Report given to RN and Post -op Vital signs reviewed and stable  Post vital signs: Reviewed and stable  Last Vitals:  Vitals Value Taken Time  BP 78/57 02/11/20 0822  Temp    Pulse 59 02/11/20 0823  Resp 14 02/11/20 0823  SpO2 95 % 02/11/20 0823  Vitals shown include unvalidated device data.  Last Pain:  Vitals:   02/11/20 0640  TempSrc: Oral  PainSc: 0-No pain         Complications: No complications documented.

## 2020-02-11 NOTE — H&P (Signed)
Jorge Dyer is an 54 y.o. male.   Chief Complaint: scalp mass HPI: The patient is a 54 yrs old male here for excision of a scalp mass. The area has been getting larger in time. No sign of infection.   Past Medical History:  Diagnosis Date  . Arthritis    Patient denies  . Chronic kidney disease    h/o kidney stones  . 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 Diabetes mellitus    type 2  . Gout   . History of kidney stones   . HNP (herniated nucleus pulposus)   . Hyperlipidemia   . Hypertension   . Hypothyroidism     Past Surgical History:  Procedure Laterality Date  . APPENDECTOMY  1993  . BACK SURGERY  2010   lower L 4 and L 5  . DECOMPRESSIVE LUMBAR LAMINECTOMY LEVEL 1 Right 08/11/2016   Procedure: MICRO LUMBAR DECOMPRESSION L5-S1 ON THE RIGHT  LEVEL 1;  Surgeon: Susa Day, MD;  Location: [facility];  Service: Orthopedics;  Laterality: Right;  Requesting 2 hours  . KIDNEY STONE SURGERY     percutaneous nephrolithomy x 2    Family History  Problem Relation Age of Onset  . Colon cancer Neg Hx   . Colon polyps Neg Hx   . Esophageal cancer Neg Hx   . Rectal cancer Neg Hx   . Stomach cancer Neg Hx    Social History:  reports that he has never smoked. He has never used smokeless tobacco. He reports current alcohol use. He reports that he does not use drugs.  Allergies: No Known Allergies  Medications Prior to Admission  Medication Sig Dispense Refill  . allopurinol (ZYLOPRIM) 100 MG tablet Take 100 mg by mouth daily.    . Dulaglutide (TRULICITY) 1.5 KG/8.1EH SOPN Inject 1.5 mg into the skin once a week. Thursdays    . levothyroxine (SYNTHROID, LEVOTHROID) 125 MCG tablet Take 125 mcg by mouth daily before breakfast.    . losartan-hydrochlorothiazide (HYZAAR) 100-25 MG per tablet Take 1 tablet by mouth daily.     . phentermine (ADIPEX-P) 37.5 MG tablet Take 37.5 mg by mouth every morning.     . simvastatin (ZOCOR) 40 MG tablet   7  . tiopronin (THIOLA) 100 MG tablet Take 200 mg by mouth 2  (two) times daily. Pt states he takes six 100 mg daily    . topiramate (TOPAMAX) 50 MG tablet   5    Results for orders placed or performed during the hospital encounter of 02/11/20 (from the past 48 hour(s))  Basic metabolic panel     Status: Abnormal   Collection Time: 02/09/20  9:04 AM  Result Value Ref Range   Sodium 137 135 - 145 mmol/L   Potassium 4.7 3.5 - 5.1 mmol/L   Chloride 107 98 - 111 mmol/L   CO2 21 (L) 22 - 32 mmol/L   Glucose, Bld 172 (H) 70 - 99 mg/dL    Comment: Glucose reference range applies only to samples taken after fasting for at least 8 hours.   BUN 33 (H) 6 - 20 mg/dL   Creatinine, Ser 1.85 (H) 0.61 - 1.24 mg/dL   Calcium 9.1 8.9 - 10.3 mg/dL   GFR calc non Af Amer 40 (L) >60 mL/min   GFR calc Af Amer 46 (L) >60 mL/min   Anion gap 9 5 - 15    Comment: Performed at [facility] [address] [place], [place] [address]  Glucose,  capillary     Status: Abnormal   Collection Time: 02/11/20  6:52 AM  Result Value Ref Range   Glucose-Capillary 163 (H) 70 - 99 mg/dL    Comment: Glucose reference range applies only to samples taken after fasting for at least 8 hours.   No results found.  Review of Systems  Constitutional: Negative.   HENT: Negative.   Eyes: Negative.   Respiratory: Negative.   Cardiovascular: Negative.   Gastrointestinal: Negative.   Endocrine: Negative.   Genitourinary: Negative.   Musculoskeletal: Negative.   Hematological: Negative.     Blood pressure 122/72, pulse 64, temperature 97.8 F (36.6 C), temperature source Oral, resp. rate 18, height 6' (1.829 m), weight 119.8 kg, SpO2 99 %. Physical Exam Vitals and nursing note reviewed.  Constitutional:      Appearance: Normal appearance.  HENT:     Head: Normocephalic.   Cardiovascular:     Rate and Rhythm: Normal rate.     Pulses: Normal pulses.  Pulmonary:     Effort: Pulmonary effort is normal.  Abdominal:     General: Abdomen is flat. There is no distension.    Skin:    General: Skin is warm.     Capillary Refill: Capillary refill takes less than 2 seconds.  Neurological:     General: No focal deficit present.     Mental Status: He is alert and oriented to person, place, and time.  Psychiatric:        Mood and Affect: Mood normal.        Behavior: Behavior normal.      Assessment/Plan Posterior scalp mass Plan excision of mass with path evaluation.  The risks that can be encountered with and after excision of a skin lesion were discussed and include the following but not limited to these: bleeding, infection, delayed healing, anesthesia risks, skin sensation changes, injury to structures including nerves, blood vessels, and muscles which may be temporary or permanent, allergies to tape, suture materials and glues, blood products, topical preparations or injected agents, skin contour irregularities, skin discoloration and swelling, deep vein thrombosis, cardiac and pulmonary complications, pain, which may persist, persistent pain, recurrence of the lesion, poor healing of the incision, possible need for revisional surgery or staged procedures.    [place], DO 02/11/2020, 7:15 AM

## 2020-02-11 NOTE — Anesthesia Preprocedure Evaluation (Signed)
Anesthesia Evaluation  Patient identified by MRN, date of birth, ID band Patient awake    Reviewed: Allergy & Precautions, NPO status , Patient's Chart, lab work & pertinent test results  Airway Mallampati: II  TM Distance: >3 FB Neck ROM: Full    Dental no notable dental hx.    Pulmonary neg pulmonary ROS,    Pulmonary exam normal breath sounds clear to auscultation       Cardiovascular hypertension, Pt. on medications Normal cardiovascular exam Rhythm:Regular Rate:Normal     Neuro/Psych negative neurological ROS  negative psych ROS   GI/Hepatic negative GI ROS, Neg liver ROS,   Endo/Other  diabetes, Type 2Hypothyroidism   Renal/GU Renal InsufficiencyRenal disease     Musculoskeletal  (+) Arthritis ,   Abdominal (+) + obese,   Peds  Hematology negative hematology ROS (+)   Anesthesia Other Findings   Reproductive/Obstetrics negative OB ROS                             Anesthesia Physical  Anesthesia Plan  ASA: III  Anesthesia Plan: General   Post-op Pain Management:    Induction: Intravenous  PONV Risk Score and Plan: 2 and Ondansetron and Midazolam  Airway Management Planned: Oral ETT  Additional Equipment:   Intra-op Plan:   Post-operative Plan: Extubation in OR  Informed Consent: I have reviewed the patients History and Physical, chart, labs and discussed the procedure including the risks, benefits and alternatives for the proposed anesthesia with the patient or authorized representative who has indicated his/her understanding and acceptance.     Dental advisory given  Plan Discussed with: CRNA  Anesthesia Plan Comments:         Anesthesia Quick Evaluation

## 2020-02-11 NOTE — Discharge Instructions (Addendum)
INSTRUCTIONS FOR AFTER SURGERY   You will likely have some questions about what to expect following your operation.  The following information will help you and your family understand what to expect when you are discharged from the hospital.  Following these guidelines will help ensure a smooth recovery and reduce risks of complications.  Postoperative instructions include information on: diet, wound care, medications and physical activity.  AFTER SURGERY Expect to go home after the procedure.  In some cases, you may need to spend one night in the hospital for observation.  DIET This surgery does not require a specific diet.  However, I have to mention that the healthier you eat the better your body can start healing. It is important to increasing your protein intake.  This means limiting the foods with added sugar.  Focus on fruits and vegetables and some meat.  If you have any liposuction during your procedure be sure to drink water.  If your urine is bright yellow, then it is concentrated, and you need to drink more water.  As a general rule after surgery, you should have 8 ounces of water every hour while awake.  If you find you are persistently nauseated or unable to take in liquids let us know.  NO TOBACCO USE or EXPOSURE.  This will slow your healing process and increase the risk of a wound.  WOUND CARE You can shower the day after surgery.  Use fragrance free soap.  Dial, [place], [place] and Cetaphil are usually mild on the skin.  If you have steri-strips / tape directly attached to your skin leave them in place. It is OK to get these wet.  No baths, pools or hot tubs for two weeks. We close your incision to leave the smallest and best-looking scar. No ointment or creams on your incisions until given the go ahead.  Especially not Neosporin (Too many skin reactions with this one).  A few weeks after surgery you can use Mederma and start massaging the scar.  ACTIVITY No heavy lifting until cleared  by the doctor.  It is OK to walk and climb stairs. In fact, moving your legs is very important to decrease your risk of a blood clot.  It will also help keep you from getting deconditioned.  Every 1 to 2 hours get up and walk for 5 minutes. This will help with a quicker recovery back to normal.  Let pain be your guide so you don't do too much.  NO, you cannot do the spring cleaning and don't plan on taking care of anyone else.  This is your time for TLC.   WORK Everyone returns to work at different times. As a rough guide, most people take at least 1 - 2 weeks off prior to returning to work. If you need documentation for your job, bring the forms to your postoperative follow up visit.  DRIVING Arrange for someone to bring you home from the hospital.  You may be able to drive a few days after surgery but not while taking any narcotics or valium.  BOWEL MOVEMENTS Constipation can occur after anesthesia and while taking pain medication.  It is important to stay ahead for your comfort.  We recommend taking Milk of Magnesia (2 tablespoons; twice a day) while taking the pain pills.  SEROMA This is fluid your body tried to put in the surgical site.  This is normal but if it creates excessive pain and swelling let us know.  It usually decreases in  a few weeks.  MEDICATIONS and PAIN CONTROL At your preoperative visit for you history and physical you were given the following medications: 1. An antibiotic: Start this medication when you get home and take according to the instructions on the bottle. 2. Zofran 4 mg:  This is to treat nausea and vomiting.  You can take this every 6 hours as needed and only if needed. 3. Norco (hydrocodone/acetaminophen) 5/325 mg:  This is only to be used after you have taken the motrin or the tylenol. Every 8 hours as needed. Over the counter Medication to take: 4. Ibuprofen (Motrin) 600 mg:  Take this every 6 hours.  If you have additional pain then take 500 mg of the  tylenol.  Only take the Norco after you have tried these two. 5. Miralax or stool softener of choice: Take this according to the bottle if you take the [place] Call your surgeon's office if any of the following occur: . Fever 101 degrees F or greater . Excessive bleeding or fluid from the incision site. . Pain that increases over time without aid from the medications . Redness, warmth, or pus draining from incision sites . Persistent nausea or inability to take in liquids . Severe misshapen area that underwent the operation.   Post Anesthesia Home Care Instructions  Activity: Get plenty of rest for the remainder of the day. A responsible individual must stay with you for 24 hours following the procedure.  For the next 24 hours, DO NOT: -Drive a car -Paediatric nurse -Drink alcoholic beverages -Take any medication unless instructed by your physician -Make any legal decisions or sign important papers.  Meals: Start with liquid foods such as gelatin or soup. Progress to regular foods as tolerated. Avoid greasy, spicy, heavy foods. If nausea and/or vomiting occur, drink only clear liquids until the nausea and/or vomiting subsides. Call your physician if vomiting continues.  Special Instructions/Symptoms: Your throat may feel dry or sore from the anesthesia or the breathing tube placed in your throat during surgery. If this causes discomfort, gargle with warm salt water. The discomfort should disappear within 24 hours.  If you had a scopolamine patch placed behind your ear for the management of post- operative nausea and/or vomiting:  1. The medication in the patch is effective for 72 hours, after which it should be removed.  Wrap patch in a tissue and discard in the trash. Wash hands thoroughly with soap and water. 2. You may remove the patch earlier than 72 hours if you experience unpleasant side effects which may include dry mouth, dizziness or visual disturbances. 3.  Avoid touching the patch. Wash your hands with soap and water after contact with the patch.

## 2020-02-11 NOTE — Anesthesia Postprocedure Evaluation (Signed)
Anesthesia Post Note  Patient: Jorge Dyer  Procedure(s) Performed: Excision of posterior scalp mass (N/A Scalp)     Patient location during evaluation: PACU Anesthesia Type: General Level of consciousness: awake and alert Pain management: pain level controlled Vital Signs Assessment: post-procedure vital signs reviewed and stable Respiratory status: spontaneous breathing, nonlabored ventilation and respiratory function stable Cardiovascular status: blood pressure returned to baseline and stable Postop Assessment: no apparent nausea or vomiting Anesthetic complications: no   No complications documented.  Last Vitals:  Vitals:   02/11/20 0830 02/11/20 0853  BP: 107/65 102/81  Pulse: (!) 53 60  Resp: (!) 9 14  Temp:  36.9 C  SpO2: 96% 97%    Last Pain:  Vitals:   02/11/20 0847  TempSrc:   PainSc: 0-No pain                 Lynda Rainwater

## 2020-02-11 NOTE — Op Note (Signed)
DATE OF OPERATION: 02/11/2020  LOCATION: [facility]  PREOPERATIVE DIAGNOSIS: mass of posterior scalp  POSTOPERATIVE DIAGNOSIS: Same  PROCEDURE: Sebaceous cyst of posterior scalp  SURGEON: Sheela Mcculley Sanger Mckena Chern, DO  ASSISTANT: Roetta Sessions, PA  EBL: none  CONDITION: Stable  COMPLICATIONS: None  INDICATION: The patient, Jorge Dyer, is a 54 y.o. male born on [birth year], is here for treatment of a scalp mass.   PROCEDURE DETAILS:  The patient was seen prior to surgery and marked.  The IV antibiotics were given. The patient was taken to the operating room and given a general anesthetic. A standard time out was performed and all information was confirmed by those in the room. SCDs were placed.   The patient was placed in the prone position.  He was prepped and draped.  Local was injected for intraoperative hemostasis and post operative pain control.  The #15 blade was used to make an incision in the vertical direction.  The lesion was identified and noted to be a cyst.  The complete lesion was removed including the capsule.  The area was irrigated.  The 4-0 Monocryl was used to close the deep layers followed by vertical mattress sutures with 5-0 Monocryl.  Derma bond was applied.   The lesion was sent to pathology.   The patient was allowed to wake up and taken to recovery room in stable condition at the end of the case. The family was notified at the end of the case.   The advanced practice practitioner (APP) assisted throughout the case.  The APP was essential in retraction and counter traction when needed to make the case progress smoothly.  This retraction and assistance made it possible to see the tissue plans for the procedure.  The assistance was needed for blood control, tissue re-approximation and assisted with closure of the incision site.

## 2020-02-11 NOTE — Anesthesia Procedure Notes (Signed)
Procedure Name: Intubation Date/Time: 02/11/2020 7:37 AM Performed by: Willa Frater, CRNA Pre-anesthesia Checklist: Patient identified, Emergency Drugs available, Suction available and Patient being monitored Patient Re-evaluated:Patient Re-evaluated prior to induction Oxygen Delivery Method: Circle system utilized Preoxygenation: Pre-oxygenation with 100% oxygen Induction Type: IV induction Ventilation: Mask ventilation without difficulty Laryngoscope Size: Mac and 4 Grade View: Grade II Tube type: Oral Number of attempts: 1 Airway Equipment and Method: Stylet,  Oral airway and Bougie stylet Placement Confirmation: ETT inserted through vocal cords under direct vision,  positive ETCO2 and breath sounds checked- equal and bilateral Secured at: 24 cm Tube secured with: Tape Dental Injury: Teeth and Oropharynx as per pre-operative assessment  Difficulty Due To: Difficulty was anticipated, Difficult Airway- due to limited oral opening and Difficult Airway- due to reduced neck mobility

## 2020-02-12 ENCOUNTER — Encounter (HOSPITAL_BASED_OUTPATIENT_CLINIC_OR_DEPARTMENT_OTHER): Payer: Self-pay | Admitting: Plastic Surgery

## 2020-02-12 LAB — SURGICAL PATHOLOGY

## 2020-02-20 ENCOUNTER — Encounter: Payer: BC Managed Care – PPO | Admitting: Plastic Surgery

## 2020-02-24 ENCOUNTER — Ambulatory Visit (INDEPENDENT_AMBULATORY_CARE_PROVIDER_SITE_OTHER): Payer: BC Managed Care – PPO | Admitting: Plastic Surgery

## 2020-02-24 ENCOUNTER — Encounter: Payer: Self-pay | Admitting: Plastic Surgery

## 2020-02-24 ENCOUNTER — Other Ambulatory Visit: Payer: Self-pay

## 2020-02-24 VITALS — BP 100/68 | HR 75 | Temp 97.8°F

## 2020-02-24 DIAGNOSIS — D239 Other benign neoplasm of skin, unspecified: Secondary | ICD-10-CM

## 2020-02-24 DIAGNOSIS — R220 Localized swelling, mass and lump, head: Secondary | ICD-10-CM

## 2020-02-24 NOTE — Progress Notes (Signed)
   Subjective:    Patient ID: Jorge Dyer, male    DOB: 1966-01-01, 54 y.o.   MRN: [id number]  The patient is a 54 year old male here for follow-up after undergoing excision of a mass on the posterior scalp area.  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  Not had any problems.  There is no redness or signs of infection.  It has been minimal.     Review of Systems  Constitutional: Negative.   Eyes: Negative.   Respiratory: Negative.   Cardiovascular: Negative.   Gastrointestinal: Negative.   Genitourinary: Negative.   Musculoskeletal: Negative.        Objective:   Physical Exam Vitals and nursing note reviewed.  Constitutional:      Appearance: Normal appearance.  HENT:     Head: Normocephalic.  Cardiovascular:     Rate and Rhythm: Normal rate.     Pulses: Normal pulses.  Pulmonary:     Effort: Pulmonary effort is normal.  Neurological:     General: No focal deficit present.     Mental Status: He is alert. Mental status is at baseline.  Psychiatric:        Mood and Affect: Mood normal.        Behavior: Behavior normal.        Assessment & Plan:     ICD-10-CM   1. 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  He should do Vaseline during the day and then leave it alone at night.  Wash like normal.  Let us know if he has any trouble.  He has a small on the right parietal area.  I recommend he come back to [place] if this gets any larger.  We might be able to excise it in the office if it is all.

## 2020-02-26 ENCOUNTER — Encounter: Payer: BC Managed Care – PPO | Admitting: Surgical

## 2020-03-08 DIAGNOSIS — E1149 Type 2 diabetes mellitus with other diabetic neurological complication: Secondary | ICD-10-CM | POA: Diagnosis not present

## 2020-03-08 DIAGNOSIS — E039 Hypothyroidism, unspecified: Secondary | ICD-10-CM | POA: Diagnosis not present

## 2020-03-08 DIAGNOSIS — Z0000 Encounter for general adult medical examination without abnormal findings: Secondary | ICD-10-CM | POA: Diagnosis not present

## 2020-03-08 DIAGNOSIS — E7849 Other hyperlipidemia: Secondary | ICD-10-CM | POA: Diagnosis not present

## 2020-03-08 DIAGNOSIS — M109 Gout, unspecified: Secondary | ICD-10-CM | POA: Diagnosis not present

## 2020-03-17 DIAGNOSIS — G629 Polyneuropathy, unspecified: Secondary | ICD-10-CM | POA: Diagnosis not present

## 2020-03-17 DIAGNOSIS — E1139 Type 2 diabetes mellitus with other diabetic ophthalmic complication: Secondary | ICD-10-CM | POA: Diagnosis not present

## 2020-03-17 DIAGNOSIS — E039 Hypothyroidism, unspecified: Secondary | ICD-10-CM | POA: Diagnosis not present

## 2020-03-17 DIAGNOSIS — Z0000 Encounter for general adult medical examination without abnormal findings: Secondary | ICD-10-CM | POA: Diagnosis not present

## 2020-03-17 DIAGNOSIS — I10 Essential (primary) hypertension: Secondary | ICD-10-CM | POA: Diagnosis not present

## 2020-03-17 DIAGNOSIS — R82998 Other abnormal findings in urine: Secondary | ICD-10-CM | POA: Diagnosis not present

## 2020-03-21 ENCOUNTER — Encounter: Payer: Self-pay | Admitting: Plastic Surgery

## 2020-03-23 ENCOUNTER — Encounter: Payer: BC Managed Care – PPO | Admitting: Plastic Surgery

## 2020-11-03 DIAGNOSIS — E1139 Type 2 diabetes mellitus with other diabetic ophthalmic complication: Secondary | ICD-10-CM | POA: Diagnosis not present

## 2020-12-12 ENCOUNTER — Encounter (INDEPENDENT_AMBULATORY_CARE_PROVIDER_SITE_OTHER): Payer: Self-pay

## 2020-12-16 DIAGNOSIS — N200 Calculus of kidney: Secondary | ICD-10-CM | POA: Diagnosis not present

## 2020-12-16 DIAGNOSIS — K8020 Calculus of gallbladder without cholecystitis without obstruction: Secondary | ICD-10-CM | POA: Diagnosis not present

## 2020-12-16 DIAGNOSIS — N261 Atrophy of kidney (terminal): Secondary | ICD-10-CM | POA: Diagnosis not present

## 2020-12-16 DIAGNOSIS — N281 Cyst of kidney, acquired: Secondary | ICD-10-CM | POA: Diagnosis not present

## 2021-01-27 DIAGNOSIS — R42 Dizziness and giddiness: Secondary | ICD-10-CM | POA: Diagnosis not present

## 2021-01-27 DIAGNOSIS — E860 Dehydration: Secondary | ICD-10-CM | POA: Diagnosis not present

## 2021-03-28 DIAGNOSIS — Z125 Encounter for screening for malignant neoplasm of prostate: Secondary | ICD-10-CM | POA: Diagnosis not present

## 2021-03-28 DIAGNOSIS — E039 Hypothyroidism, unspecified: Secondary | ICD-10-CM | POA: Diagnosis not present

## 2021-03-28 DIAGNOSIS — E1149 Type 2 diabetes mellitus with other diabetic neurological complication: Secondary | ICD-10-CM | POA: Diagnosis not present

## 2021-03-28 DIAGNOSIS — E785 Hyperlipidemia, unspecified: Secondary | ICD-10-CM | POA: Diagnosis not present

## 2021-04-04 DIAGNOSIS — E1139 Type 2 diabetes mellitus with other diabetic ophthalmic complication: Secondary | ICD-10-CM | POA: Diagnosis not present

## 2021-04-04 DIAGNOSIS — Z0000 Encounter for general adult medical examination without abnormal findings: Secondary | ICD-10-CM | POA: Diagnosis not present

## 2021-04-04 DIAGNOSIS — Z1339 Encounter for screening examination for other mental health and behavioral disorders: Secondary | ICD-10-CM | POA: Diagnosis not present

## 2021-04-04 DIAGNOSIS — Z1331 Encounter for screening for depression: Secondary | ICD-10-CM | POA: Diagnosis not present

## 2021-04-05 DIAGNOSIS — R82998 Other abnormal findings in urine: Secondary | ICD-10-CM | POA: Diagnosis not present

## 2021-06-06 DIAGNOSIS — M542 Cervicalgia: Secondary | ICD-10-CM | POA: Diagnosis not present

## 2021-06-13 DIAGNOSIS — E113292 Type 2 diabetes mellitus with mild nonproliferative diabetic retinopathy without macular edema, left eye: Secondary | ICD-10-CM | POA: Diagnosis not present

## 2021-06-13 DIAGNOSIS — H52203 Unspecified astigmatism, bilateral: Secondary | ICD-10-CM | POA: Diagnosis not present

## 2021-06-13 DIAGNOSIS — H40013 Open angle with borderline findings, low risk, bilateral: Secondary | ICD-10-CM | POA: Diagnosis not present

## 2021-08-02 ENCOUNTER — Other Ambulatory Visit (HOSPITAL_COMMUNITY): Payer: Self-pay

## 2021-08-02 MED ORDER — TRULICITY 3 MG/0.5ML ~~LOC~~ SOAJ
SUBCUTANEOUS | 2 refills | Status: DC
  Filled 2021-08-02: qty 2, 28d supply, fill #0
  Filled 2021-08-29: qty 2, 28d supply, fill #1
  Filled 2021-09-26: qty 2, 28d supply, fill #2

## 2021-08-30 ENCOUNTER — Other Ambulatory Visit (HOSPITAL_COMMUNITY): Payer: Self-pay

## 2021-09-26 ENCOUNTER — Other Ambulatory Visit (HOSPITAL_COMMUNITY): Payer: Self-pay

## 2021-10-10 DIAGNOSIS — Z1331 Encounter for screening for depression: Secondary | ICD-10-CM | POA: Diagnosis not present

## 2021-10-10 DIAGNOSIS — Z1339 Encounter for screening examination for other mental health and behavioral disorders: Secondary | ICD-10-CM | POA: Diagnosis not present

## 2021-10-10 DIAGNOSIS — E1149 Type 2 diabetes mellitus with other diabetic neurological complication: Secondary | ICD-10-CM | POA: Diagnosis not present

## 2021-10-10 DIAGNOSIS — I129 Hypertensive chronic kidney disease with stage 1 through stage 4 chronic kidney disease, or unspecified chronic kidney disease: Secondary | ICD-10-CM | POA: Diagnosis not present

## 2021-10-23 ENCOUNTER — Other Ambulatory Visit (HOSPITAL_COMMUNITY): Payer: Self-pay

## 2021-10-24 ENCOUNTER — Other Ambulatory Visit (HOSPITAL_COMMUNITY): Payer: Self-pay

## 2021-10-24 MED ORDER — TRULICITY 3 MG/0.5ML ~~LOC~~ SOAJ
SUBCUTANEOUS | 11 refills | Status: AC
  Filled 2021-10-24: qty 2, 28d supply, fill #0
  Filled 2021-11-27: qty 2, 28d supply, fill #1
  Filled 2021-12-25: qty 2, 28d supply, fill #2
  Filled 2022-01-24: qty 2, 28d supply, fill #3
  Filled 2022-02-13: qty 2, 28d supply, fill #4
  Filled 2022-03-13: qty 2, 28d supply, fill #5
  Filled 2022-04-09: qty 2, 28d supply, fill #6
  Filled 2022-05-07: qty 2, 28d supply, fill #7
  Filled 2022-06-10: qty 2, 28d supply, fill #8

## 2021-11-28 ENCOUNTER — Other Ambulatory Visit (HOSPITAL_COMMUNITY): Payer: Self-pay

## 2021-12-26 ENCOUNTER — Other Ambulatory Visit (HOSPITAL_COMMUNITY): Payer: Self-pay

## 2022-01-20 DIAGNOSIS — E7209 Other disorders of amino-acid transport: Secondary | ICD-10-CM | POA: Diagnosis not present

## 2022-01-20 DIAGNOSIS — E7201 Cystinuria: Secondary | ICD-10-CM | POA: Diagnosis not present

## 2022-01-20 DIAGNOSIS — N200 Calculus of kidney: Secondary | ICD-10-CM | POA: Diagnosis not present

## 2022-01-20 DIAGNOSIS — N281 Cyst of kidney, acquired: Secondary | ICD-10-CM | POA: Diagnosis not present

## 2022-01-24 ENCOUNTER — Other Ambulatory Visit (HOSPITAL_COMMUNITY): Payer: Self-pay

## 2022-02-13 ENCOUNTER — Other Ambulatory Visit (HOSPITAL_COMMUNITY): Payer: Self-pay

## 2022-03-14 ENCOUNTER — Other Ambulatory Visit (HOSPITAL_COMMUNITY): Payer: Self-pay

## 2022-04-10 ENCOUNTER — Other Ambulatory Visit (HOSPITAL_COMMUNITY): Payer: Self-pay

## 2022-04-13 ENCOUNTER — Encounter: Payer: Self-pay | Admitting: Internal Medicine

## 2022-04-15 ENCOUNTER — Other Ambulatory Visit (HOSPITAL_COMMUNITY): Payer: Self-pay

## 2022-05-08 ENCOUNTER — Other Ambulatory Visit (HOSPITAL_COMMUNITY): Payer: Self-pay

## 2022-05-16 ENCOUNTER — Ambulatory Visit (AMBULATORY_SURGERY_CENTER): Payer: Self-pay

## 2022-05-16 VITALS — Ht 72.0 in | Wt 268.6 lb

## 2022-05-16 DIAGNOSIS — Z86010 Personal history of colonic polyps: Secondary | ICD-10-CM

## 2022-05-16 MED ORDER — NA SULFATE-K SULFATE-MG SULF 17.5-3.13-1.6 GM/177ML PO SOLN: 1.0000 | Freq: Once | ORAL | 0 refills | Status: AC

## 2022-05-16 NOTE — Progress Notes (Signed)
No egg or soy allergy known to patient  No issues known to pt with past sedation with any surgeries or procedures Patient denies ever being told they had issues or difficulty with intubation  No FH of Malignant Hyperthermia Pt is not on diet pills Pt is not on  home 02  Pt is not on blood thinners  Pt denies issues with constipation  No A fib or A flutter Have any cardiac testing pending--no Pt instructed to use [email or web address] or GoodRx for a price reduction on prep   

## 2022-05-23 DIAGNOSIS — R0981 Nasal congestion: Secondary | ICD-10-CM | POA: Diagnosis not present

## 2022-05-23 DIAGNOSIS — R051 Acute cough: Secondary | ICD-10-CM | POA: Diagnosis not present

## 2022-05-23 DIAGNOSIS — E1149 Type 2 diabetes mellitus with other diabetic neurological complication: Secondary | ICD-10-CM | POA: Diagnosis not present

## 2022-06-10 ENCOUNTER — Other Ambulatory Visit (HOSPITAL_COMMUNITY): Payer: Self-pay

## 2022-06-12 DIAGNOSIS — H40013 Open angle with borderline findings, low risk, bilateral: Secondary | ICD-10-CM | POA: Diagnosis not present

## 2022-06-12 DIAGNOSIS — H52203 Unspecified astigmatism, bilateral: Secondary | ICD-10-CM | POA: Diagnosis not present

## 2022-06-12 DIAGNOSIS — E113292 Type 2 diabetes mellitus with mild nonproliferative diabetic retinopathy without macular edema, left eye: Secondary | ICD-10-CM | POA: Diagnosis not present

## 2022-06-12 DIAGNOSIS — H40053 Ocular hypertension, bilateral: Secondary | ICD-10-CM | POA: Diagnosis not present

## 2022-06-13 DIAGNOSIS — E039 Hypothyroidism, unspecified: Secondary | ICD-10-CM | POA: Diagnosis not present

## 2022-06-13 DIAGNOSIS — E785 Hyperlipidemia, unspecified: Secondary | ICD-10-CM | POA: Diagnosis not present

## 2022-06-13 DIAGNOSIS — M109 Gout, unspecified: Secondary | ICD-10-CM | POA: Diagnosis not present

## 2022-06-13 DIAGNOSIS — E1139 Type 2 diabetes mellitus with other diabetic ophthalmic complication: Secondary | ICD-10-CM | POA: Diagnosis not present

## 2022-06-15 ENCOUNTER — Other Ambulatory Visit (HOSPITAL_COMMUNITY): Payer: Self-pay

## 2022-06-15 DIAGNOSIS — I129 Hypertensive chronic kidney disease with stage 1 through stage 4 chronic kidney disease, or unspecified chronic kidney disease: Secondary | ICD-10-CM | POA: Diagnosis not present

## 2022-06-15 DIAGNOSIS — Z23 Encounter for immunization: Secondary | ICD-10-CM | POA: Diagnosis not present

## 2022-06-15 DIAGNOSIS — Z1331 Encounter for screening for depression: Secondary | ICD-10-CM | POA: Diagnosis not present

## 2022-06-15 DIAGNOSIS — R82998 Other abnormal findings in urine: Secondary | ICD-10-CM | POA: Diagnosis not present

## 2022-06-15 DIAGNOSIS — N184 Chronic kidney disease, stage 4 (severe): Secondary | ICD-10-CM | POA: Diagnosis not present

## 2022-06-15 DIAGNOSIS — Z1339 Encounter for screening examination for other mental health and behavioral disorders: Secondary | ICD-10-CM | POA: Diagnosis not present

## 2022-06-15 DIAGNOSIS — Z0000 Encounter for general adult medical examination without abnormal findings: Secondary | ICD-10-CM | POA: Diagnosis not present

## 2022-06-15 MED ORDER — MOUNJARO 10 MG/0.5ML ~~LOC~~ SOAJ
10.0000 mg | SUBCUTANEOUS | 0 refills | Status: AC
  Filled 2022-06-15: qty 2, 28d supply, fill #0

## 2022-06-15 MED ORDER — MOUNJARO 15 MG/0.5ML ~~LOC~~ SOAJ
15.0000 mg | SUBCUTANEOUS | 0 refills | Status: AC
  Filled 2022-06-15: qty 2, 28d supply, fill #0

## 2022-06-15 MED ORDER — MOUNJARO 7.5 MG/0.5ML ~~LOC~~ SOAJ
7.5000 mg | SUBCUTANEOUS | 0 refills | Status: DC
  Filled 2022-06-15: qty 2, 28d supply, fill #0

## 2022-06-15 MED ORDER — MOUNJARO 12.5 MG/0.5ML ~~LOC~~ SOAJ
12.5000 mg | SUBCUTANEOUS | 0 refills | Status: AC
  Filled 2022-06-15: qty 2, 28d supply, fill #0

## 2022-06-19 ENCOUNTER — Other Ambulatory Visit (HOSPITAL_COMMUNITY): Payer: Self-pay

## 2022-06-30 ENCOUNTER — Other Ambulatory Visit (HOSPITAL_COMMUNITY): Payer: Self-pay

## 2022-07-05 ENCOUNTER — Encounter: Payer: Self-pay | Admitting: Internal Medicine

## 2022-07-13 ENCOUNTER — Ambulatory Visit (AMBULATORY_SURGERY_CENTER): Payer: BC Managed Care – PPO | Admitting: Internal Medicine

## 2022-07-13 ENCOUNTER — Encounter: Payer: Self-pay | Admitting: Internal Medicine

## 2022-07-13 VITALS — BP 95/58 | HR 69 | Temp 97.8°F | Resp 16 | Ht 72.0 in | Wt 268.6 lb

## 2022-07-13 DIAGNOSIS — D122 Benign neoplasm of ascending colon: Secondary | ICD-10-CM | POA: Diagnosis not present

## 2022-07-13 DIAGNOSIS — Z1211 Encounter for screening for malignant neoplasm of colon: Secondary | ICD-10-CM | POA: Diagnosis not present

## 2022-07-13 DIAGNOSIS — D123 Benign neoplasm of transverse colon: Secondary | ICD-10-CM | POA: Diagnosis not present

## 2022-07-13 DIAGNOSIS — Z86010 Personal history of colonic polyps: Secondary | ICD-10-CM

## 2022-07-13 DIAGNOSIS — Z09 Encounter for follow-up examination after completed treatment for conditions other than malignant neoplasm: Secondary | ICD-10-CM | POA: Diagnosis not present

## 2022-07-13 MED ORDER — SODIUM CHLORIDE 0.9 % IV SOLN: 500.0000 mL | Freq: Once | INTRAVENOUS | Status: DC

## 2022-07-13 NOTE — Progress Notes (Signed)
VS by DT  Pt's states no medical or surgical changes since previsit or office visit.  

## 2022-07-13 NOTE — Op Note (Signed)
[facility] Patient Name: Jorge Dyer Procedure Date: 07/13/2022 2:37 PM MRN: [id number] Endoscopist: Jerene Bears , MD, [id number] Age: 56 Referring MD:  Date of Birth: 03/02/1966 Gender: Male Account #: [id number] Procedure:                Colonoscopy Indications:              High risk colon cancer surveillance: Personal                            history of non-advanced adenoma, Last colonoscopy:                            July 2018 Medicines:                Monitored Anesthesia Care Procedure:                Pre-Anesthesia Assessment:                           - Prior to the procedure, a History and Physical                            was performed, and patient medications and                            allergies were reviewed. The patient's tolerance of                            previous anesthesia was also reviewed. The risks                            and benefits of the procedure and the sedation                            options and risks were discussed with the patient.                            All questions were answered, and informed consent                            was obtained. Prior Anticoagulants: The patient has                            taken no anticoagulant or antiplatelet agents. ASA                            Grade Assessment: II - A patient with mild systemic                            disease. After reviewing the risks and benefits,                            the patient was deemed in satisfactory condition to  undergo the procedure.                           After obtaining informed consent, the colonoscope                            was passed under direct vision. Throughout the                            procedure, the patient's blood pressure, pulse, and                            oxygen saturations were monitored continuously. The                            Olympus Scope [id number] was introduced through the                             anus and advanced to the cecum, identified by                            appendiceal orifice and ileocecal valve. The                            colonoscopy was performed without difficulty. The                            patient tolerated the procedure well. The quality                            of the bowel preparation was good. The ileocecal                            valve, appendiceal orifice, and rectum were                            photographed. Scope In: 2:42:47 PM Scope Out: 3:05:04 PM Scope Withdrawal Time: 0 hours 13 minutes 48 seconds  Total Procedure Duration: 0 hours 22 minutes 17 seconds  Findings:                 The digital rectal exam was normal.                           A 4 mm polyp was found in the ascending colon. The                            polyp was sessile. The polyp was removed with a                            cold snare. Resection and retrieval were complete.                           Two sessile polyps were found in the transverse  colon. The polyps were 4 to 5 mm in size. These                            polyps were removed with a cold snare. Resection                            and retrieval were complete.                           There was a small lipoma, in the descending colon.                           The exam was otherwise without abnormality on                            direct and retroflexion views. Complications:            No immediate complications. Estimated Blood Loss:     Estimated blood loss was minimal. Impression:               - One 4 mm polyp in the ascending colon, removed                            with a cold snare. Resected and retrieved.                           - Two 4 to 5 mm polyps in the transverse colon,                            removed with a cold snare. Resected and retrieved.                           - Small lipoma in the descending colon.                           -  The examination was otherwise normal on direct                            and retroflexion views. Recommendation:           - Patient has a contact number available for                            emergencies. The signs and symptoms of potential                            delayed complications were discussed with the                            patient. Return to normal activities tomorrow.                            Written discharge instructions were provided to the  patient.                           - Resume previous diet.                           - Continue present medications.                           - Await pathology results.                           - Repeat colonoscopy is recommended for                            surveillance. The colonoscopy date will be                            determined after pathology results from today's                            exam become available for review. Jerene Bears, MD 07/13/2022 3:08:14 PM This report has been signed electronically.

## 2022-07-13 NOTE — Progress Notes (Signed)
Called to room to assist during endoscopic procedure.  Patient ID and intended procedure confirmed with present staff. Received instructions for my participation in the procedure from the performing physician.  

## 2022-07-13 NOTE — Progress Notes (Signed)
Report to PACU, RN, vss, BBS= Clear.  

## 2022-07-13 NOTE — Patient Instructions (Signed)
Handout provided about polyps.  Resume previous diet.  Continue present medications.  Await pathology results.  Repeat colonoscopy is recommended for surveillance.   The colonoscopy date will be determined after pathology results from today's exam become available for review.    YOU HAD AN ENDOSCOPIC PROCEDURE TODAY AT [facility]:   Refer to the procedure report that was given to you for any specific questions about what was found during the examination.  If the procedure report does not answer your questions, please call your gastroenterologist to clarify.  If you requested that your care partner not be given the details of your procedure findings, then the procedure report has been included in a sealed envelope for you to review at your convenience later.  YOU SHOULD EXPECT: Some feelings of bloating in the abdomen. Passage of more gas than usual.  Walking can help get rid of the air that was put into your GI tract during the procedure and reduce the bloating. If you had a lower endoscopy (such as a colonoscopy or flexible sigmoidoscopy) you may notice spotting of blood in your stool or on the toilet paper. If you underwent a bowel prep for your procedure, you may not have a normal bowel movement for a few days.  Please Note:  You might notice some irritation and congestion in your nose or some drainage.  This is from the oxygen used during your procedure.  There is no need for concern and it should clear up in a day or so.  SYMPTOMS TO REPORT IMMEDIATELY:  Following lower endoscopy (colonoscopy or flexible sigmoidoscopy):  Excessive amounts of blood in the stool  Significant tenderness or worsening of abdominal pains  Swelling of the abdomen that is new, acute  Fever of 100F or higher  For urgent or emergent issues, a gastroenterologist can be reached at any hour by calling [phone]. Do not use MyChart messaging for urgent concerns.    DIET:  We do recommend a small  meal at first, but then you may proceed to your regular diet.  Drink plenty of fluids but you should avoid alcoholic beverages for 24 hours.  ACTIVITY:  You should plan to take it easy for the rest of today and you should NOT DRIVE or use heavy machinery until tomorrow (because of the sedation medicines used during the test).    FOLLOW UP: Our staff will call the number listed on your records the next business day following your procedure.  We will call around 7:15- 8:00 am to check on you and address any questions or concerns that you may have regarding the information given to you following your procedure. If we do not reach you, we will leave a message.     If any biopsies were taken you will be contacted by phone or by letter within the next 1-3 weeks.  Please call us at [phone] if you have not heard about the biopsies in 3 weeks.    SIGNATURES/CONFIDENTIALITY: You and/or your care partner have signed paperwork which will be entered into your electronic medical record.  These signatures attest to the fact that that the information above on your After Visit Summary has been reviewed and is understood.  Full responsibility of the confidentiality of this discharge information lies with you and/or your care-partner.

## 2022-07-13 NOTE — Progress Notes (Signed)
GASTROENTEROLOGY PROCEDURE H&P NOTE   Primary Care Physician: Ginger Organ., MD    Reason for Procedure:  History of adenomatous colon polyp  Plan:    Colonoscopy  Patient is appropriate for endoscopic procedure(s) in the ambulatory ([place]) setting.  The nature of the procedure, as well as the risks, benefits, and alternatives were carefully and thoroughly reviewed with the patient. Ample time for discussion and questions allowed. The patient understood, was satisfied, and agreed to proceed.     HPI: Jorge Dyer is a 56 y.o. male who presents for surveillance colonoscopy.  Medical history as below.  Tolerated the prep.  No recent chest pain or shortness of breath.  No abdominal pain today.  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[place])    Diabetes mellitus    type 2   Gout    History of kidney stones    HNP (herniated nucleus pulposus)    Hyperlipidemia    Hypertension    Hypothyroidism     Past Surgical History:  Procedure Laterality Date   APPENDECTOMY  1993   BACK SURGERY  2010   lower L 4 and L 5   COLONOSCOPY     DECOMPRESSIVE LUMBAR LAMINECTOMY LEVEL 1 Right 08/11/2016   Procedure: MICRO LUMBAR DECOMPRESSION L5-S1 ON THE RIGHT  LEVEL 1;  Surgeon: Susa Day, MD;  Location: [facility];  Service: Orthopedics;  Laterality: Right;  Requesting 2 hours   EXCISION MASS HEAD N/A 02/11/2020   Procedure: Excision of posterior scalp mass;  Surgeon: Wallace Going, DO;  Location: [facility];  Service: Plastics;  Laterality: N/A;  45 min, please   KIDNEY STONE SURGERY     percutaneous nephrolithomy x 2   POLYPECTOMY      Prior to Admission medications   Medication Sig Start Date End Date Taking? Authorizing Provider  allopurinol (ZYLOPRIM) 100 MG tablet Take 100 mg by mouth daily.   Yes [provider]  Dulaglutide (TRULICITY) 1.5 ZO/1.0RU SOPN Inject  1.5 mg into the skin once a week. Thursdays   Yes [provider]  levothyroxine (SYNTHROID, LEVOTHROID) 125 MCG tablet Take 125 mcg by mouth daily before breakfast.   Yes [provider]  losartan-hydrochlorothiazide (HYZAAR) 100-25 MG per tablet Take 1 tablet by mouth daily.  02/23/12  Yes [provider]  simvastatin (ZOCOR) 40 MG tablet  02/16/17  Yes [provider]  tiopronin (THIOLA) 100 MG tablet Take 200 mg by mouth 2 (two) times daily. Pt states he takes six 100 mg daily   Yes [provider]  topiramate (TOPAMAX) 50 MG tablet  03/05/17  Yes [provider]  Dulaglutide (TRULICITY) 3 EA/5.4UJ SOPN Inject 1 pen into the skin as directed weekly Patient not taking: Reported on 07/13/2022 10/24/21     hydrochlorothiazide (HYDRODIURIL) 25 MG tablet Take 25 mg by mouth daily. Patient not taking: Reported on 07/13/2022 02/26/22   [provider]  levothyroxine (SYNTHROID) 125 MCG tablet Take by mouth. Patient not taking: Reported on 07/13/2022 12/22/15   [provider]  losartan (COZAAR) 100 MG tablet Take 100 mg by mouth daily. Patient not taking: Reported on 07/13/2022 02/27/22   [provider]  phentermine (ADIPEX-P) 37.5 MG tablet Take 37.5 mg by mouth every morning.  Patient not taking: Reported on 07/13/2022 12/22/19   [provider]  Potassium  Citrate 15 MEQ (1620 MG) TBCR Take 1 tablet by mouth 2 (two) times daily. Patient not taking: Reported on 05/16/2022 01/20/22   [provider]  tamsulosin (FLOMAX) 0.4 MG CAPS capsule Take 0.4 mg by mouth daily. Patient not taking: Reported on 05/16/2022 05/01/22   [provider]  THIOLA EC 100 MG TBEC Take 2 tablets by mouth 2 (two) times daily. 05/02/22   [provider]  tirzepatide Darcel Bayley) 10 MG/0.5ML Pen Inject 10 mg into the skin once a week. Patient not taking: Reported on 07/13/2022 06/15/22     tirzepatide (MOUNJARO) 12.5  MG/0.5ML Pen Inject 12.5 mg into the skin once a week. Patient not taking: Reported on 07/13/2022 06/15/22     tirzepatide (MOUNJARO) 15 MG/0.5ML Pen Inject 15 mg into the skin once a week. Patient not taking: Reported on 07/13/2022 06/15/22     tirzepatide (MOUNJARO) 7.5 MG/0.5ML Pen Inject 7.5 mg into the skin once a week. Patient not taking: Reported on 07/13/2022 06/15/22       Current Outpatient Medications  Medication Sig Dispense Refill   allopurinol (ZYLOPRIM) 100 MG tablet Take 100 mg by mouth daily.     Dulaglutide (TRULICITY) 1.5 WP/8.0DX SOPN Inject 1.5 mg into the skin once a week. Thursdays     levothyroxine (SYNTHROID, LEVOTHROID) 125 MCG tablet Take 125 mcg by mouth daily before breakfast.     losartan-hydrochlorothiazide (HYZAAR) 100-25 MG per tablet Take 1 tablet by mouth daily.      simvastatin (ZOCOR) 40 MG tablet   7   tiopronin (THIOLA) 100 MG tablet Take 200 mg by mouth 2 (two) times daily. Pt states he takes six 100 mg daily     topiramate (TOPAMAX) 50 MG tablet   5   Dulaglutide (TRULICITY) 3 IP/3.8SN SOPN Inject 1 pen into the skin as directed weekly (Patient not taking: Reported on 07/13/2022) 2 mL 11   hydrochlorothiazide (HYDRODIURIL) 25 MG tablet Take 25 mg by mouth daily. (Patient not taking: Reported on 07/13/2022)     levothyroxine (SYNTHROID) 125 MCG tablet Take by mouth. (Patient not taking: Reported on 07/13/2022)     losartan (COZAAR) 100 MG tablet Take 100 mg by mouth daily. (Patient not taking: Reported on 07/13/2022)     phentermine (ADIPEX-P) 37.5 MG tablet Take 37.5 mg by mouth every morning.  (Patient not taking: Reported on 07/13/2022)     Potassium Citrate 15 MEQ (1620 MG) TBCR Take 1 tablet by mouth 2 (two) times daily. (Patient not taking: Reported on 05/16/2022)     tamsulosin (FLOMAX) 0.4 MG CAPS capsule Take 0.4 mg by mouth daily. (Patient not taking: Reported on 05/16/2022)     THIOLA EC 100 MG TBEC Take 2 tablets by mouth 2 (two) times daily.      tirzepatide (MOUNJARO) 10 MG/0.5ML Pen Inject 10 mg into the skin once a week. (Patient not taking: Reported on 07/13/2022) 2 mL 0   tirzepatide (MOUNJARO) 12.5 MG/0.5ML Pen Inject 12.5 mg into the skin once a week. (Patient not taking: Reported on 07/13/2022) 2 mL 0   tirzepatide (MOUNJARO) 15 MG/0.5ML Pen Inject 15 mg into the skin once a week. (Patient not taking: Reported on 07/13/2022) 2 mL 0   tirzepatide (MOUNJARO) 7.5 MG/0.5ML Pen Inject 7.5 mg into the skin once a week. (Patient not taking: Reported on 07/13/2022) 2 mL 0   Current Facility-Administered Medications  Medication Dose Route Frequency Provider Last Rate Last Admin   0.9 %  sodium chloride infusion  500  mL Intravenous Once Imre Vecchione, Lajuan Lines, MD        Allergies as of 07/13/2022   (No Known Allergies)    Family History  Problem Relation Age of Onset   Colon cancer Neg Hx    Colon polyps Neg Hx    Esophageal cancer Neg Hx    Rectal cancer Neg Hx    Stomach cancer Neg Hx     Social History   Socioeconomic History   Marital status: Married    Spouse name: Not on file   Number of children: Not on file   Years of education: Not on file   Highest education level: Not on file  Occupational History   Not on file  Tobacco Use   Smoking status: Never   Smokeless tobacco: Never  Vaping Use   Vaping Use: Never used  Substance and Sexual Activity   Alcohol use: Yes    Comment: ocassionally, couple times a month one drink   Drug use: Never   Sexual activity: Yes  Other Topics Concern   Not on file  Social History Narrative   Not on file   Social Determinants of Health   Financial Resource Strain: Not on file  Food Insecurity: Not on file  Transportation Needs: Not on file  Physical Activity: Not on file  Stress: Not on file  Social Connections: Not on file  Intimate Partner Violence: Not on file    Physical Exam: Vital signs in last 24 hours: '@BP'$  124/62   Pulse 74   Temp 97.8 F (36.6 C)   Ht 6'  (1.829 m)   Wt 268 lb 9.6 oz (121.8 kg)   SpO2 96%   BMI 36.43 kg/m  GEN: NAD EYE: Sclerae anicteric ENT: MMM CV: Non-tachycardic Pulm: CTA b/l GI: Soft, NT/ND NEURO:  Alert & Oriented x 3   Zenovia Jarred, MD [facility]  07/13/2022 2:36 PM

## 2022-07-14 ENCOUNTER — Telehealth: Payer: Self-pay

## 2022-07-14 NOTE — Telephone Encounter (Signed)
Left message on follow up caLL.

## 2022-07-19 ENCOUNTER — Encounter: Payer: Self-pay | Admitting: Internal Medicine

## 2022-07-19 ENCOUNTER — Encounter: Payer: BC Managed Care – PPO | Admitting: Internal Medicine

## 2022-07-30 ENCOUNTER — Other Ambulatory Visit (HOSPITAL_COMMUNITY): Payer: Self-pay

## 2022-07-31 ENCOUNTER — Other Ambulatory Visit (HOSPITAL_COMMUNITY): Payer: Self-pay

## 2022-07-31 MED ORDER — MOUNJARO 10 MG/0.5ML ~~LOC~~ SOAJ
10.0000 mg | SUBCUTANEOUS | 0 refills | Status: DC
  Filled 2022-07-31: qty 2, 28d supply, fill #0

## 2022-08-01 ENCOUNTER — Other Ambulatory Visit (HOSPITAL_COMMUNITY): Payer: Self-pay

## 2022-08-01 MED ORDER — MOUNJARO 10 MG/0.5ML ~~LOC~~ SOAJ
10.0000 mg | SUBCUTANEOUS | 0 refills | Status: AC
  Filled 2022-08-01: qty 2, 28d supply, fill #0

## 2022-08-01 MED ORDER — MOUNJARO 12.5 MG/0.5ML ~~LOC~~ SOAJ: 12.5000 mg | SUBCUTANEOUS | 0 refills | Status: AC

## 2022-08-02 ENCOUNTER — Other Ambulatory Visit (HOSPITAL_COMMUNITY): Payer: Self-pay

## 2022-08-28 ENCOUNTER — Other Ambulatory Visit (HOSPITAL_COMMUNITY): Payer: Self-pay

## 2022-08-29 ENCOUNTER — Other Ambulatory Visit (HOSPITAL_COMMUNITY): Payer: Self-pay

## 2022-08-29 MED ORDER — MOUNJARO 10 MG/0.5ML ~~LOC~~ SOAJ
SUBCUTANEOUS | 11 refills | Status: AC
  Filled 2022-08-29: qty 2, 28d supply, fill #0
  Filled 2022-09-24: qty 2, 28d supply, fill #1
  Filled 2022-10-22: qty 2, 28d supply, fill #2
  Filled 2022-11-19: qty 2, 28d supply, fill #3
  Filled 2022-12-17: qty 2, 28d supply, fill #4
  Filled 2023-01-14 – 2023-01-27 (×3): qty 2, 28d supply, fill #5
  Filled 2023-02-17: qty 2, 28d supply, fill #6
  Filled 2023-03-19: qty 2, 28d supply, fill #7
  Filled 2023-04-15: qty 2, 28d supply, fill #8
  Filled 2023-05-13: qty 2, 28d supply, fill #9
  Filled 2023-06-10: qty 2, 28d supply, fill #10

## 2022-12-22 DIAGNOSIS — E1139 Type 2 diabetes mellitus with other diabetic ophthalmic complication: Secondary | ICD-10-CM | POA: Diagnosis not present

## 2022-12-22 DIAGNOSIS — E785 Hyperlipidemia, unspecified: Secondary | ICD-10-CM | POA: Diagnosis not present

## 2022-12-22 DIAGNOSIS — E1149 Type 2 diabetes mellitus with other diabetic neurological complication: Secondary | ICD-10-CM | POA: Diagnosis not present

## 2022-12-22 DIAGNOSIS — I129 Hypertensive chronic kidney disease with stage 1 through stage 4 chronic kidney disease, or unspecified chronic kidney disease: Secondary | ICD-10-CM | POA: Diagnosis not present

## 2022-12-22 DIAGNOSIS — E039 Hypothyroidism, unspecified: Secondary | ICD-10-CM | POA: Diagnosis not present

## 2023-01-15 DIAGNOSIS — E7201 Cystinuria: Secondary | ICD-10-CM | POA: Diagnosis not present

## 2023-01-15 DIAGNOSIS — N200 Calculus of kidney: Secondary | ICD-10-CM | POA: Diagnosis not present

## 2023-01-15 DIAGNOSIS — N281 Cyst of kidney, acquired: Secondary | ICD-10-CM | POA: Diagnosis not present

## 2023-01-15 DIAGNOSIS — E7209 Other disorders of amino-acid transport: Secondary | ICD-10-CM | POA: Diagnosis not present

## 2023-01-19 ENCOUNTER — Other Ambulatory Visit (HOSPITAL_BASED_OUTPATIENT_CLINIC_OR_DEPARTMENT_OTHER): Payer: Self-pay

## 2023-01-19 ENCOUNTER — Other Ambulatory Visit (HOSPITAL_COMMUNITY): Payer: Self-pay

## 2023-01-19 MED ORDER — MOUNJARO 7.5 MG/0.5ML ~~LOC~~ SOAJ
7.5000 mg | SUBCUTANEOUS | 1 refills | Status: AC
  Filled 2023-01-19 – 2023-01-20 (×2): qty 2, 28d supply, fill #0

## 2023-01-20 ENCOUNTER — Other Ambulatory Visit (HOSPITAL_BASED_OUTPATIENT_CLINIC_OR_DEPARTMENT_OTHER): Payer: Self-pay

## 2023-01-22 ENCOUNTER — Other Ambulatory Visit: Payer: Self-pay

## 2023-01-23 ENCOUNTER — Other Ambulatory Visit (HOSPITAL_BASED_OUTPATIENT_CLINIC_OR_DEPARTMENT_OTHER): Payer: Self-pay

## 2023-01-27 ENCOUNTER — Other Ambulatory Visit (HOSPITAL_COMMUNITY): Payer: Self-pay

## 2023-01-29 ENCOUNTER — Other Ambulatory Visit (HOSPITAL_BASED_OUTPATIENT_CLINIC_OR_DEPARTMENT_OTHER): Payer: Self-pay

## 2023-03-19 ENCOUNTER — Other Ambulatory Visit (HOSPITAL_COMMUNITY): Payer: Self-pay

## 2023-06-22 DIAGNOSIS — E1139 Type 2 diabetes mellitus with other diabetic ophthalmic complication: Secondary | ICD-10-CM | POA: Diagnosis not present

## 2023-06-22 DIAGNOSIS — I10 Essential (primary) hypertension: Secondary | ICD-10-CM | POA: Diagnosis not present

## 2023-06-22 DIAGNOSIS — E039 Hypothyroidism, unspecified: Secondary | ICD-10-CM | POA: Diagnosis not present

## 2023-06-22 DIAGNOSIS — G629 Polyneuropathy, unspecified: Secondary | ICD-10-CM | POA: Diagnosis not present

## 2023-06-29 ENCOUNTER — Other Ambulatory Visit (HOSPITAL_COMMUNITY): Payer: Self-pay

## 2023-06-29 DIAGNOSIS — Z1331 Encounter for screening for depression: Secondary | ICD-10-CM | POA: Diagnosis not present

## 2023-06-29 DIAGNOSIS — Z0000 Encounter for general adult medical examination without abnormal findings: Secondary | ICD-10-CM | POA: Diagnosis not present

## 2023-06-29 DIAGNOSIS — Z125 Encounter for screening for malignant neoplasm of prostate: Secondary | ICD-10-CM | POA: Diagnosis not present

## 2023-06-29 DIAGNOSIS — R82998 Other abnormal findings in urine: Secondary | ICD-10-CM | POA: Diagnosis not present

## 2023-06-29 DIAGNOSIS — Z1339 Encounter for screening examination for other mental health and behavioral disorders: Secondary | ICD-10-CM | POA: Diagnosis not present

## 2023-06-29 DIAGNOSIS — Z23 Encounter for immunization: Secondary | ICD-10-CM | POA: Diagnosis not present

## 2023-06-29 DIAGNOSIS — I129 Hypertensive chronic kidney disease with stage 1 through stage 4 chronic kidney disease, or unspecified chronic kidney disease: Secondary | ICD-10-CM | POA: Diagnosis not present

## 2023-06-29 DIAGNOSIS — E1149 Type 2 diabetes mellitus with other diabetic neurological complication: Secondary | ICD-10-CM | POA: Diagnosis not present

## 2023-06-29 MED ORDER — MOUNJARO 12.5 MG/0.5ML ~~LOC~~ SOAJ
12.5000 mg | SUBCUTANEOUS | 11 refills | Status: AC
  Filled 2023-06-29: qty 2, 28d supply, fill #0
  Filled 2023-07-22: qty 2, 28d supply, fill #1
  Filled 2023-08-12 – 2023-08-17 (×2): qty 2, 28d supply, fill #2
  Filled 2023-09-13 – 2023-09-14 (×2): qty 2, 28d supply, fill #3
  Filled 2023-10-07 – 2023-10-12 (×2): qty 2, 28d supply, fill #4
  Filled 2023-11-05 – 2023-11-09 (×2): qty 2, 28d supply, fill #5
  Filled 2023-12-03: qty 2, 28d supply, fill #6
  Filled 2024-01-06: qty 2, 28d supply, fill #7

## 2023-07-28 ENCOUNTER — Other Ambulatory Visit (HOSPITAL_COMMUNITY): Payer: Self-pay

## 2023-08-12 ENCOUNTER — Encounter (HOSPITAL_COMMUNITY): Payer: Self-pay | Admitting: Pharmacist

## 2023-08-12 ENCOUNTER — Other Ambulatory Visit (HOSPITAL_COMMUNITY): Payer: Self-pay

## 2023-08-16 ENCOUNTER — Other Ambulatory Visit (HOSPITAL_COMMUNITY): Payer: Self-pay

## 2023-08-17 ENCOUNTER — Other Ambulatory Visit (HOSPITAL_COMMUNITY): Payer: Self-pay

## 2023-08-17 ENCOUNTER — Other Ambulatory Visit (HOSPITAL_BASED_OUTPATIENT_CLINIC_OR_DEPARTMENT_OTHER): Payer: Self-pay

## 2023-08-28 DIAGNOSIS — E113292 Type 2 diabetes mellitus with mild nonproliferative diabetic retinopathy without macular edema, left eye: Secondary | ICD-10-CM | POA: Diagnosis not present

## 2023-08-28 DIAGNOSIS — H40013 Open angle with borderline findings, low risk, bilateral: Secondary | ICD-10-CM | POA: Diagnosis not present

## 2023-08-28 DIAGNOSIS — H5213 Myopia, bilateral: Secondary | ICD-10-CM | POA: Diagnosis not present

## 2023-09-06 DIAGNOSIS — I129 Hypertensive chronic kidney disease with stage 1 through stage 4 chronic kidney disease, or unspecified chronic kidney disease: Secondary | ICD-10-CM | POA: Diagnosis not present

## 2023-09-06 DIAGNOSIS — N2581 Secondary hyperparathyroidism of renal origin: Secondary | ICD-10-CM | POA: Diagnosis not present

## 2023-09-06 DIAGNOSIS — D631 Anemia in chronic kidney disease: Secondary | ICD-10-CM | POA: Diagnosis not present

## 2023-09-06 DIAGNOSIS — E1122 Type 2 diabetes mellitus with diabetic chronic kidney disease: Secondary | ICD-10-CM | POA: Diagnosis not present

## 2023-09-06 DIAGNOSIS — N189 Chronic kidney disease, unspecified: Secondary | ICD-10-CM | POA: Diagnosis not present

## 2023-09-06 DIAGNOSIS — N184 Chronic kidney disease, stage 4 (severe): Secondary | ICD-10-CM | POA: Diagnosis not present

## 2023-09-14 ENCOUNTER — Other Ambulatory Visit (HOSPITAL_COMMUNITY): Payer: Self-pay

## 2023-10-08 ENCOUNTER — Other Ambulatory Visit (HOSPITAL_COMMUNITY): Payer: Self-pay

## 2023-10-12 ENCOUNTER — Other Ambulatory Visit (HOSPITAL_COMMUNITY): Payer: Self-pay

## 2023-11-08 ENCOUNTER — Other Ambulatory Visit (HOSPITAL_COMMUNITY): Payer: Self-pay

## 2023-11-10 ENCOUNTER — Other Ambulatory Visit (HOSPITAL_COMMUNITY): Payer: Self-pay

## 2023-12-10 ENCOUNTER — Other Ambulatory Visit (HOSPITAL_COMMUNITY): Payer: Self-pay

## 2024-01-03 DIAGNOSIS — I129 Hypertensive chronic kidney disease with stage 1 through stage 4 chronic kidney disease, or unspecified chronic kidney disease: Secondary | ICD-10-CM | POA: Diagnosis not present

## 2024-01-03 DIAGNOSIS — E1149 Type 2 diabetes mellitus with other diabetic neurological complication: Secondary | ICD-10-CM | POA: Diagnosis not present

## 2024-01-14 DIAGNOSIS — Z87442 Personal history of urinary calculi: Secondary | ICD-10-CM | POA: Diagnosis not present

## 2024-01-14 DIAGNOSIS — Z841 Family history of disorders of kidney and ureter: Secondary | ICD-10-CM | POA: Diagnosis not present

## 2024-01-14 DIAGNOSIS — E7209 Other disorders of amino-acid transport: Secondary | ICD-10-CM | POA: Diagnosis not present

## 2024-01-14 DIAGNOSIS — N281 Cyst of kidney, acquired: Secondary | ICD-10-CM | POA: Diagnosis not present

## 2024-01-15 ENCOUNTER — Other Ambulatory Visit (HOSPITAL_COMMUNITY): Payer: Self-pay

## 2024-01-15 MED ORDER — MOUNJARO 15 MG/0.5ML ~~LOC~~ SOAJ
15.0000 mg | SUBCUTANEOUS | 11 refills | Status: AC
  Filled 2024-01-15 – 2024-01-18 (×2): qty 2, 28d supply, fill #0
  Filled 2024-02-12: qty 2, 28d supply, fill #1
  Filled 2024-03-09: qty 2, 28d supply, fill #2
  Filled 2024-04-13: qty 2, 28d supply, fill #3
  Filled 2024-05-11: qty 2, 28d supply, fill #4
  Filled 2024-06-08: qty 2, 28d supply, fill #5
  Filled 2024-07-06: qty 2, 28d supply, fill #6
  Filled 2024-08-03: qty 2, 28d supply, fill #7
  Filled 2024-09-01: qty 2, 28d supply, fill #8

## 2024-01-18 ENCOUNTER — Other Ambulatory Visit (HOSPITAL_COMMUNITY): Payer: Self-pay

## 2024-04-17 DIAGNOSIS — I129 Hypertensive chronic kidney disease with stage 1 through stage 4 chronic kidney disease, or unspecified chronic kidney disease: Secondary | ICD-10-CM | POA: Diagnosis not present

## 2024-04-17 DIAGNOSIS — D631 Anemia in chronic kidney disease: Secondary | ICD-10-CM | POA: Diagnosis not present

## 2024-04-17 DIAGNOSIS — N184 Chronic kidney disease, stage 4 (severe): Secondary | ICD-10-CM | POA: Diagnosis not present

## 2024-04-17 DIAGNOSIS — E1122 Type 2 diabetes mellitus with diabetic chronic kidney disease: Secondary | ICD-10-CM | POA: Diagnosis not present

## 2024-04-17 DIAGNOSIS — N2581 Secondary hyperparathyroidism of renal origin: Secondary | ICD-10-CM | POA: Diagnosis not present

## 2024-04-17 DIAGNOSIS — N189 Chronic kidney disease, unspecified: Secondary | ICD-10-CM | POA: Diagnosis not present

## 2024-08-01 DIAGNOSIS — R82998 Other abnormal findings in urine: Secondary | ICD-10-CM | POA: Diagnosis not present

## 2024-08-26 ENCOUNTER — Encounter: Payer: Self-pay | Admitting: Cardiology

## 2024-08-26 ENCOUNTER — Ambulatory Visit: Attending: Cardiology | Admitting: Cardiology

## 2024-08-26 VITALS — BP 131/66 | HR 70 | Ht 71.0 in | Wt 237.0 lb

## 2024-08-26 DIAGNOSIS — I7090 Unspecified atherosclerosis: Secondary | ICD-10-CM | POA: Diagnosis not present

## 2024-08-26 DIAGNOSIS — I2510 Atherosclerotic heart disease of native coronary artery without angina pectoris: Secondary | ICD-10-CM | POA: Diagnosis not present

## 2024-08-26 NOTE — Progress Notes (Signed)
 " Cardiology Office Note:  .   Date:  08/26/2024  ID:  Jorge Dyer, DOB 29-May-1966, MRN [id number] PCP: Jorge Dyer., MD  [facility] Providers Cardiologist:  None     History of Present Illness: .   Jorge Dyer is a 59 y.o. male Discussed the use of AI scribe   History of Present Illness Jorge Dyer is a 59 year old male with hypertensive chronic kidney disease and type 2 diabetes who presents for evaluation of coronary artery calcification.  A recent CT scan revealed a calcium score of 2669, placing him in the 99th percentile for coronary artery calcification. He is asymptomatic, with no chest pain or shortness of breath, and maintains an active lifestyle, walking two to three miles a day and exercising on a treadmill.  He has hypertensive chronic kidney disease, managed with hydrochlorothiazide  25 mg and losartan  100 mg. His blood pressure is well-controlled, with a recent reading of 118/68.  He has type 2 diabetes diagnosed in 2009, with complications including retinopathy and neuropathy. His diabetes is managed with Jardiance 25 mg and Mounjaro  injections, and his last hemoglobin A1c was 5.4, showing significant improvement.  He has a history of recurrent kidney stones, with two surgeries for stone removal. His baseline creatinine is approximately 2.3, with a recent value of 1.9. 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  He was recently switched from simvastatin to Crestor 20 mg to manage his cholesterol, with a last LDL of 82. He also takes aspirin 81 mg and Synthroid  125 mcg for hypothyroidism.  His family history includes a father who had a heart attack at 55 and a grandfather who had a heart attack at 57, both smokers.  He is a nonsmoker, walks about three times a week, and has lost about 50 pounds over the last ten years. He tries to maintain a healthy diet, although he wants to eat better.      ROS: No syncope bleeding orthopnea PND  Studies  Reviewed: SABRA   EKG Interpretation Date/Time:  Tuesday August 26 2024 09:58:03 EST Ventricular Rate:  70 PR Interval:  160 QRS Duration:  122 QT Interval:  386 QTC Calculation: 416 R Axis:   -66  Text Interpretation: Normal sinus rhythm Left anterior fascicular block Left ventricular hypertrophy with QRS widening and repolarization abnormality ( R in aVL , Cornell product ) When compared with ECG of 09-Feb-2020 09:09, T wave inversion less evident in Lateral leads Confirmed by Jeffrie Anes ([id number]) on 08/26/2024 10:01:44 AM    Results Labs LDL (2024): 82 Hemoglobin J8r (07/2024): 5.3 Hemoglobin (2024): 15.8 Creatinine (2024): 1.9 ALT (2024): 22  Radiology Coronary artery calcium score (08/19/2024): Calcium score 2669, ninety-ninth percentile, extensive coronary artery calcification Risk Assessment/Calculations:            Physical Exam:   VS:  BP 131/66   Pulse 70   Ht 5' 11 (1.803 m)   Wt 237 lb (107.5 kg)   SpO2 96%   BMI 33.05 kg/m    Wt Readings from Last 3 Encounters:  08/26/24 237 lb (107.5 kg)  07/13/22 268 lb 9.6 oz (121.8 kg)  05/16/22 268 lb 9.6 oz (121.8 kg)    GEN: Well nourished, well developed in no acute distress NECK: No JVD; No carotid bruits CARDIAC: RRR, no murmurs, no rubs, no gallops RESPIRATORY:  Clear to auscultation without rales, wheezing or rhonchi  ABDOMEN: Soft, non-tender, non-distended EXTREMITIES:  No edema; No deformity  ASSESSMENT AND PLAN: .    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 No current symptoms of chest pain or dyspnea. Family history of heart disease. Discussed the pathophysiology of coronary artery disease, including the role of soft plaque and calcification. Emphasized the importance of lowering LDL cholesterol to reduce cardiovascular risk. Discussed potential for  soft plaque rupture leading to myocardial infarction. Explained that calcium score is a marker of risk, and lowering LDL can stabilize plaque. Discussed potential for increased calcium score with statin therapy due to plaque stabilization. - Continue recent change to rosuvastatin 20 mg daily to lower LDL cholesterol. - Aim for LDL cholesterol goal of less than 55 mg/dL. - Continue aspirin 81 mg daily. - Ordered cardiac PET stress test to assess for decreased blood flow and evaluate ejection fraction.  This offers us  better sensitivity and specificity especially in the setting of triple-vessel disease or diffuse coronary calcification.  We will be able to see coronary flow reserve and try to exclude or diagnose flow-limiting triple-vessel disease if present underlying and without any significant symptoms.   - Will consider adding ezetimibe or Repatha if LDL goal is not achieved with rosuvastatin alone.  Diabetic chronic kidney disease Chronic kidney disease with baseline creatinine of 2.3 mg/dL. Current creatinine is 1.9 mg/dL. Discussed the impact of kidney function on cardiovascular testing, particularly avoiding contrast due to potential nephrotoxicity. - Avoid coronary CT scan with contrast due to kidney function concerns if possible. - Continue to monitor kidney function regularly.  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 Long-standing diabetes with complications including retinopathy and neuropathy. Discussed the role of diabetes in cardiovascular risk and the importance of maintaining good glycemic control. - Continue current diabetes management regimen.  Hyperlipidemia Managed with rosuvastatin. Recent LDL cholesterol was 82 in 7975. Discussed the importance of lowering LDL to reduce cardiovascular risk, especially given the extensive coronary calcification. - Continue rosuvastatin 20 mg daily. - Aim  for LDL cholesterol goal of less than 55 mg/dL given the significance of coronary calcium score. - Will consider adding ezetimibe or Repatha if LDL goal is not achieved with rosuvastatin alone.  PCSK9 inhibitor may be best for him given the extensive nature of coronary calcification present.  Hypothyroidism Managed with Synthroid  125 mcg daily. - Continue Synthroid  125 mcg daily.       Informed Consent   Shared Decision Making/Informed Consent The risks [chest pain, shortness of breath, cardiac arrhythmias, dizziness, blood pressure fluctuations, myocardial infarction, stroke/transient ischemic attack, nausea, vomiting, allergic reaction, radiation exposure, metallic taste sensation and life-threatening complications (estimated to be 1 in 10,000)], benefits (risk stratification, diagnosing coronary artery disease, treatment guidance) and alternatives of a cardiac PET stress test were discussed in detail with Mr. Staubs and he agrees to proceed.     Dispo: 6 months  Signed, Oneil Parchment, MD  "

## 2024-08-26 NOTE — Patient Instructions (Signed)
 Medication Instructions:  The current medical regimen is effective;  continue present plan and medications.  *If you need a refill on your cardiac medications before your next appointment, please call your pharmacy*  Testing/Procedures:    Please report to Radiology at the [facility] Main Entrance 30 minutes early for your test.  [address] [place], [place] [address]  How to Prepare for Your Cardiac PET/CT Stress Test:  Nothing to eat or drink, except water, 3 hours prior to arrival time.  NO caffeine/decaffeinated products, or chocolate 12 hours prior to arrival. (Please note decaffeinated beverages (teas/coffees) still contain caffeine).  If you have caffeine within 12 hours prior, the test will need to be rescheduled.  Medication instructions: Do not take erectile dysfunction medications for 72 hours prior to test (sildenafil, tadalafil) Do not take nitrates (isosorbide mononitrate, Ranexa) the day before or day of test Do not take tamsulosin the day before or morning of test Hold theophylline containing medications for 12 hours. Hold Dipyridamole 48 hours prior to the test.  Diabetic Preparation: If able to eat breakfast prior to 3 hour fasting, you may take all medications, including your insulin . Do not worry if you miss your breakfast dose of insulin  - start at your next meal. If you do not eat prior to 3 hour fast-Hold all diabetes (oral and insulin ) medications. Patients who wear a continuous glucose monitor MUST remove the device prior to scanning.  You may take your remaining medications with water.  NO perfume, cologne or lotion on chest or abdomen area. FEMALES - Please avoid wearing dresses to this appointment.  Total time is 1 to 2 hours; you may want to bring reading material for the waiting time.  IF YOU THINK YOU MAY BE PREGNANT, OR ARE NURSING PLEASE INFORM THE TECHNOLOGIST.  In preparation for your appointment, medication and supplies will be  purchased.  Appointment availability is limited, so if you need to cancel or reschedule, please call the Radiology Department Scheduler at [phone] 24 hours in advance to avoid a cancellation fee of $100.00  What to Expect When you Arrive:  Once you arrive and check in for your appointment, you will be taken to a preparation room within the Radiology Department.  A technologist or Nurse will obtain your medical history, verify that you are correctly prepped for the exam, and explain the procedure.  Afterwards, an IV will be started in your arm and electrodes will be placed on your skin for EKG monitoring during the stress portion of the exam. Then you will be escorted to the PET/CT scanner.  There, staff will get you positioned on the scanner and obtain a blood pressure and EKG.  During the exam, you will continue to be connected to the EKG and blood pressure machines.  A small, safe amount of a radioactive tracer will be injected in your IV to obtain a series of pictures of your heart along with an injection of a stress agent.    After your Exam:  It is recommended that you eat a meal and drink a caffeinated beverage to counter act any effects of the stress agent.  Drink plenty of fluids for the remainder of the day and urinate frequently for the first couple of hours after the exam.  Your doctor will inform you of your test results within 7-10 business days.  For more information and frequently asked questions, please visit our website: [email or web address]  For questions about your test or how to prepare  for your test, please call: Cardiac Imaging Nurse Navigators Office: [phone]   Follow-Up: At [facility], you and your health needs are our priority.  As part of our continuing mission to provide you with exceptional heart care, our providers are all part of one team.  This team includes your primary Cardiologist (physician) and Advanced Practice Providers or  APPs (Physician Assistants and Nurse Practitioners) who all work together to provide you with the care you need, when you need it.  Your next appointment:   6 month(s)  Provider:   Dr Jeffrie     We recommend signing up for the patient portal called MyChart.  Sign up information is provided on this After Visit Summary.  MyChart is used to connect with patients for Virtual Visits (Telemedicine).  Patients are able to view lab/test results, encounter notes, upcoming appointments, etc.  Non-urgent messages can be sent to your provider as well.   To learn more about what you can do with MyChart, go to [email or web address].

## 2024-09-06 ENCOUNTER — Other Ambulatory Visit (HOSPITAL_COMMUNITY): Payer: Self-pay

## 2024-09-24 ENCOUNTER — Encounter (HOSPITAL_COMMUNITY)
